# Patient Record
Sex: Male | Born: 1935 | Race: Black or African American | Hispanic: No | Marital: Married | Smoking: Former smoker
Health system: Southern US, Community
[De-identification: ages and names within clinical notes are randomized; demographics above are authoritative.]

## PROBLEM LIST (undated history)

## (undated) DIAGNOSIS — I2510 Atherosclerotic heart disease of native coronary artery without angina pectoris: Secondary | ICD-10-CM

## (undated) DIAGNOSIS — G629 Polyneuropathy, unspecified: Secondary | ICD-10-CM

## (undated) DIAGNOSIS — F0280 Dementia in other diseases classified elsewhere without behavioral disturbance: Secondary | ICD-10-CM

## (undated) DIAGNOSIS — N183 Chronic kidney disease, stage 3 unspecified: Secondary | ICD-10-CM

## (undated) DIAGNOSIS — I714 Abdominal aortic aneurysm, without rupture, unspecified: Secondary | ICD-10-CM

## (undated) DIAGNOSIS — G309 Alzheimer's disease, unspecified: Secondary | ICD-10-CM

## (undated) DIAGNOSIS — E1143 Type 2 diabetes mellitus with diabetic autonomic (poly)neuropathy: Secondary | ICD-10-CM

## (undated) DIAGNOSIS — E1142 Type 2 diabetes mellitus with diabetic polyneuropathy: Secondary | ICD-10-CM

## (undated) DIAGNOSIS — I10 Essential (primary) hypertension: Secondary | ICD-10-CM

## (undated) DIAGNOSIS — E785 Hyperlipidemia, unspecified: Secondary | ICD-10-CM

## (undated) HISTORY — DX: Essential (primary) hypertension: I10

## (undated) HISTORY — DX: Alzheimer's disease, unspecified: G30.9

## (undated) HISTORY — DX: Chronic kidney disease, stage 3 unspecified: N18.30

## (undated) HISTORY — DX: Abdominal aortic aneurysm, without rupture: I71.4

## (undated) HISTORY — DX: Dementia in other diseases classified elsewhere, unspecified severity, without behavioral disturbance, psychotic disturbance, mood disturbance, and anxiety: F02.80

## (undated) HISTORY — DX: Hyperlipidemia, unspecified: E78.5

## (undated) HISTORY — DX: Type 2 diabetes mellitus with diabetic polyneuropathy: E11.42

## (undated) HISTORY — DX: Chronic kidney disease, stage 3 (moderate): N18.3

## (undated) HISTORY — DX: Abdominal aortic aneurysm, without rupture, unspecified: I71.40

## (undated) HISTORY — PX: OTHER SURGICAL HISTORY: SHX169

## (undated) HISTORY — DX: Type 2 diabetes mellitus with diabetic autonomic (poly)neuropathy: E11.43

## (undated) HISTORY — DX: Polyneuropathy, unspecified: G62.9

## (undated) HISTORY — DX: Atherosclerotic heart disease of native coronary artery without angina pectoris: I25.10

---

## 1998-02-19 ENCOUNTER — Ambulatory Visit (HOSPITAL_COMMUNITY): Admission: RE | Admit: 1998-02-19 | Discharge: 1998-02-19 | Payer: Self-pay | Admitting: Cardiology

## 1998-03-08 ENCOUNTER — Ambulatory Visit (HOSPITAL_COMMUNITY): Admission: RE | Admit: 1998-03-08 | Discharge: 1998-03-08 | Payer: Self-pay | Admitting: Cardiology

## 1999-06-10 ENCOUNTER — Emergency Department (HOSPITAL_COMMUNITY): Admission: EM | Admit: 1999-06-10 | Discharge: 1999-06-10 | Payer: Self-pay | Admitting: Emergency Medicine

## 1999-06-10 ENCOUNTER — Encounter: Payer: Self-pay | Admitting: *Deleted

## 1999-09-11 ENCOUNTER — Ambulatory Visit (HOSPITAL_COMMUNITY): Admission: RE | Admit: 1999-09-11 | Discharge: 1999-09-11 | Payer: Self-pay | Admitting: Gastroenterology

## 2004-09-26 ENCOUNTER — Ambulatory Visit (HOSPITAL_COMMUNITY): Admission: RE | Admit: 2004-09-26 | Discharge: 2004-09-26 | Payer: Self-pay | Admitting: Gastroenterology

## 2004-09-26 ENCOUNTER — Encounter (INDEPENDENT_AMBULATORY_CARE_PROVIDER_SITE_OTHER): Payer: Self-pay | Admitting: Specialist

## 2004-11-08 ENCOUNTER — Encounter: Admission: RE | Admit: 2004-11-08 | Discharge: 2004-11-08 | Payer: Self-pay | Admitting: Family Medicine

## 2005-01-22 ENCOUNTER — Encounter: Admission: RE | Admit: 2005-01-22 | Discharge: 2005-01-22 | Payer: Self-pay | Admitting: Internal Medicine

## 2006-10-27 DIAGNOSIS — I2510 Atherosclerotic heart disease of native coronary artery without angina pectoris: Secondary | ICD-10-CM

## 2006-10-27 HISTORY — PX: CARDIAC CATHETERIZATION: SHX172

## 2006-10-27 HISTORY — DX: Atherosclerotic heart disease of native coronary artery without angina pectoris: I25.10

## 2007-07-01 ENCOUNTER — Inpatient Hospital Stay (HOSPITAL_COMMUNITY): Admission: EM | Admit: 2007-07-01 | Discharge: 2007-07-03 | Payer: Self-pay | Admitting: Emergency Medicine

## 2007-07-23 ENCOUNTER — Ambulatory Visit (HOSPITAL_COMMUNITY): Admission: RE | Admit: 2007-07-23 | Discharge: 2007-07-23 | Payer: Self-pay | Admitting: Cardiology

## 2007-08-16 ENCOUNTER — Inpatient Hospital Stay (HOSPITAL_COMMUNITY): Admission: EM | Admit: 2007-08-16 | Discharge: 2007-08-19 | Payer: Self-pay | Admitting: Emergency Medicine

## 2007-09-10 ENCOUNTER — Ambulatory Visit: Payer: Self-pay | Admitting: Cardiology

## 2007-09-14 ENCOUNTER — Ambulatory Visit: Payer: Self-pay | Admitting: Cardiology

## 2007-10-25 ENCOUNTER — Ambulatory Visit: Payer: Self-pay | Admitting: Cardiology

## 2008-01-26 HISTORY — PX: DOPPLER ECHOCARDIOGRAPHY: SHX263

## 2008-11-27 HISTORY — PX: NM MYOVIEW LTD: HXRAD82

## 2009-09-13 ENCOUNTER — Encounter: Admission: RE | Admit: 2009-09-13 | Discharge: 2009-09-13 | Payer: Self-pay | Admitting: Internal Medicine

## 2011-02-26 ENCOUNTER — Ambulatory Visit (HOSPITAL_COMMUNITY)
Admission: RE | Admit: 2011-02-26 | Discharge: 2011-02-26 | Disposition: A | Discharge status: Home / Self Care | Payer: Medicare Other | Source: Ambulatory Visit | Attending: Internal Medicine | Admitting: Internal Medicine

## 2011-02-26 DIAGNOSIS — M79609 Pain in unspecified limb: Secondary | ICD-10-CM | POA: Insufficient documentation

## 2011-02-26 DIAGNOSIS — I739 Peripheral vascular disease, unspecified: Secondary | ICD-10-CM

## 2011-03-11 NOTE — Discharge Summary (Signed)
NAMEJUBAL, RADEMAKER NO.:  [id number]   MEDICAL RECORD NO.:  [id number]          PATIENT TYPE:  INP   LOCATION:  3729                         FACILITY:  [facility]   PHYSICIAN:  Madaline Savage, M.D.DATE OF BIRTH:  Jul 28, 1934   DATE OF ADMISSION:  07/01/2007  DATE OF DISCHARGE:  07/03/2007                               DISCHARGE SUMMARY   DISCHARGE DIAGNOSES:  1. Unstable angina with mild elevation in troponin but negative CK-MB.  2. Coronary artery disease with diffuse nonobstructive disease in the      RCA and the LAD but more small vessel disease of the second      diagonal branch, 98% stenosis x2 but a very small vessel, and the      OM 3, again 80% stenosis but a small vessel.  To be treated      medically with normal ejection fraction of 60%.  3. Questionable iliac aneurysm for outpatient.  CT of the iliacs to      evaluate aneurysm more fully.  4. Renal insufficiency, slightly elevated on I stat and then totally      abnormal at 3 which we felt was a mistake, though at discharge his      creatinine was 1.47.  5. Diabetes mellitus 2, stable.  6. Hyperlipidemia.  Restarted on Zocor.  7. History of coronary disease with previous angioplasty of distal      right coronary artery in October 1998.  8. Hypertension.   DISCHARGE CONDITION:  Improved.   PROCEDURES:  Combined left heart cath on July 02, 2007 by Dr.  Susa Griffins.   DISCHARGE MEDICATIONS:  1. Janumet 50/500 mg daily.  Do not start taking until 07/05/2007.      Wait for cath lab dye to clear his system.  2. Zocor 40 mg every evening.  3. Niaspan 500 mg daily.  4. Aspirin 325 mg daily.  5. Norvasc 5 mg daily x 10 days, and then hopefully we will be able to      let patient restart Lotrel.  6. Toprol XL 25 mg daily.  7. Nitroglycerin sublingual under tongue as needed for chest pain.  8. Imdur 30 mg daily.   FOLLOWUP:  Have lab work done in 1 week to check his kidney function.  Stop  taking Lotrel at that time.   DISCHARGE INSTRUCTIONS:  1. Low-sodium, heart-healthy, diabetic diet.  2. Increase activity slowly.  3. May shower or bathe.  4. No lifting for 2 days.  5. No driving for 2 days.  6. Keep groin cath sites clean and dry.  Call for redness, swelling,      drainage, or pain.   FURTHER FOLLOWUP:  1. Follow up with Dr. Elsie Lincoln within 2 weeks or our office will call      you with date and time.  2. Obtain CT of iliacs to evaluate for aneurysm.  Our office will      arrange as well, but we will wait for a couple of weeks until      creatinine has normalized.   HISTORY OF  PRESENT ILLNESS:  The patient is a 77 year old African  American male with a previous MI in 1998 with a 100% occluded RCA who  underwent angioplasty that was stable.  He had not seen Dr. Elsie Lincoln for  some years, and he was in a good state of health until July 01, 2007.  He was sitting at home watching television.  He began having mild  substernal chest pain, left arm discomfort, mild shortness of breath,  diaphoresis but no nausea or vomiting.  Chest pain was relieved with one  sublingual nitroglycerin, and in the ER, he was chest pain free.  This  was the first episode he had had since his previous MI, and he thought  he was having an MI this time.   PAST MEDICAL HISTORY:  1. Diabetes mellitus.  2. Hypertension.  3. Hyperlipidemia.  4. Remote back surgery.  5. Myocardial infarction as stated.   Family history, social history, and review of systems, see H&P.   ALLERGIES:  DEMEROL.   OUTPATIENT MEDICATIONS:  1. Lotrel 540.  2. Janumet 5500.  3. He had stopped taking his cholesterol meds.  4. He does take an aspirin.   PHYSICAL EXAMINATION AT DISCHARGE:  VITAL SIGNS:  Blood pressure 134/87,  pulse 64, respirations 18, temperature 97.4.  Oxygen saturations on room  air 98%.  HEART:  Regular rate and rhythm.  PULMONARY:  Lungs were clear except for occasional expiratory  wheezes.  He had had some cold symptoms for about a month.  GENITOURINARY:  Right groin cath site was stable.  No hematoma.  No  bruits and 2+ pedal.   LABORATORY DATA:  Hemoglobin 12.6, hematocrit 37.4, WBC 4.4, platelets  182,000.  These remained stable throughout hospitalization.  Chemistry  on admission:  Sodium 137, potassium 4.4, chloride 104, CO2 26, BUN 22.  Creatinine initially on I stat was 3.4; followup was 1.52, and a further  followup was 1.37.  Glucose 131.  We held his ACE during the whole  hospitalization.   Pro time 13.5, INR of 1, PTT 34, heparin 1.51.  LFTs were normal.  Cardiac troponin Is were 0.16, 0.09, and 0.07.   CKs were all negative at 84, 74, and 120 with MBs of 2.6, 2.5, and 3.2.   Cholesterol 204.  LDL was 161, HDL 30, triglycerides 66.  Magnesium 2.1  and calcium 9.2.  BNP was less than 30.  Glycohemoglobin was elevated at  7.8, but he is diabetic, and TSH 1.442.   CHEST X-RAY:  No acute disease.  Lungs were clear.  Heart size normal.  No effusion or focal bony abnormality.   CARDIAC CATHETERIZATION:  Please see dictated report, but please also  note that he had no abdominal aortic aneurysm, but there was aneurysmal  dilatation from the mid to distal portion of the common iliacs  bilaterally, felt to be mild to moderate.  There was good flow.  Hypogastrics were intact bilaterally.  No significant common or external  iliac stenosis.  Cardiac disease as previously discussed.   HOSPITAL COURSE:  The patient was admitted by Dr. Domingo Sep on July 01, 2007 secondary to chest pain.  He was brought in to rule out MI.  Troponins were up.  CK-MBs were negative, felt to be secondary to  unstable angina.  He underwent cardiac cath and was found to have small  vessel disease, but his larger vessels were nonobstructive disease.  Medications were adjusted with adding Imdur and Toprol.  For his  wheezes, we added Mucinex over-the-counter, and we will also need  a CT  of his iliacs to evaluate further his aneurysmal iliacs.  During the  hospitalization, there was a question of how stable his creatinine was  since we got an I stat that was 3.5.  Most likely, this was erroneous,  but we have held his ACE here in the hospital.  He got a lot of fluids  and Mucomyst, and he will have a repeat basic metabolic panel done 1  week.  At that time, we can restart his Lotrel as long as everything is  stable.  He will follow up with Dr. Elsie Lincoln as stated.      Darcella Gasman. Annie Paras, N.P.    ______________________________  Madaline Savage, M.D.    LRI/MEDQ  D:  07/03/2007  T:  07/03/2007  Job:  [id number]   cc:   Madaline Savage, M.D.  Eric L. [name], M.D.

## 2011-03-11 NOTE — Discharge Summary (Signed)
NAMEJAMEEK, Jake Rollins NO.:  [id number]   MEDICAL RECORD NO.:  [id number]          PATIENT TYPE:  INP   LOCATION:  2006                         FACILITY:  [facility]   PHYSICIAN:  Madaline Savage, M.D.DATE OF BIRTH:  Nov 12, 1933   DATE OF ADMISSION:  08/16/2007  DATE OF DISCHARGE:  08/19/2007                               DISCHARGE SUMMARY   DISCHARGE DIAGNOSES:  1. Status post non-ST elevation myocardial infarction during this      admission.  2. Acute bronchitis.  3. Known coronary artery disease, status post history of myocardial      infarction in the past.  Last cath was in September 2008 showing      diffuse severe coronary disease involving small vessel not amenable      to intervention.  4. Hypertension.  5. Adult-onset diabetes mellitus.  6. Hyperlipidemia.  7. Renal insufficiency.   HISTORY:  This is a 77 year old African-American gentleman with previous  history of coronary disease with myocardial infarction and recent  catheterization in September 2008 showing diffuse severe coronary  disease on medical management, who presented to the emergency room with  complaints of chest pain.  He had lingering pain and discomfort in the  chest over the weekend, but in the morning on Monday, he felt that  symptoms got worse and decided to come to [facility] for  evaluation.  He also complained of pleuritic chest pain with deep  inspiration and productive cough of greenish sputum.  The patient also  had complaints of dyspnea and even panting with exertion.  We saw him in  the emergency room and admitted him to the telemetry unit with rule out  MI protocol.   His enzymes were abnormally elevated, but EKG did not reveal any acute  changes.  The patient was diagnosed with non-ST elevation myocardial  infarction.  Because of his previous recent catheterization and renal  insufficiency, the decision was made to treat him medically.  He was  started on Ranexa  with the dose of Imdur being adjusted in the hospital.   The patient's blood pressure also was running on the higher side, so we  added Clonidine.  For his bronchitis, we were treating him initially  with Zithromax, but after adding Ranexa and interactions between these 2  medications, Zithromax-the latter was switched to Augmentin.  On the day  of discharge, the patient was seen by Dr. Elsie Lincoln and considered to be  stable for discharge home.   LABORATORY DATA:  White blood cell count was 7.6, hemoglobin 11.2,  hematocrit 33.6, platelet count 116,  sodium 133, potassium 4.7,  chloride 99, CO2 26, BUN 22, creatinine 2.12, glucose 207.  His enzymes,  first set CK 230, CK-MB 28.6, troponin, 2.75.  Second set CK 216, CK-MB  28 and troponin 2.39.  Third set 206/20.2/3.56 accordingly.   DISCHARGE MEDICATIONS:  1. Simvastatin 40 mg daily.  2. Toprol XL 25 mg daily.  3. Aspirin 325 mg daily.  4. Niaspan 500 mg daily.  5. Norvasc 10 mg daily.  6. Janumet 5/500 mg daily.  7. Imdur 60 mg daily.  8. Ranexa 500 mg b.i.d.  9. Clonidine 0.1 mg b.i.d.  10.Augmentin 875 mg b.i.d.  11.Mucinex 1200 mg b.i.d.   DISCHARGE DIET:  Low-fat, low-salt, low-cholesterol diet.   ACTIVITY:  The patient was instructed to increase activity slowly.   FOLLOW UP:  1. He is to follow up with family practice physician and to address      his issue of bronchitis.  2. Dr. Elsie Lincoln will see the patient in our office on September 02, 2007      at noon.      Jake Rollins, P.A.    ______________________________  Madaline Savage, M.D.    MK/MEDQ  D:  08/19/2007  T:  08/20/2007  Job:  [id number]   cc:   Madaline Savage, M.D.  [place], MD  [facility]

## 2011-03-11 NOTE — Cardiovascular Report (Signed)
NAMECHIN, WACHTER                  ACCOUNT NO.:  [id number]   MEDICAL RECORD NO.:  [id number]          PATIENT TYPE:  INP   LOCATION:  3729                         FACILITY:  [facility]   PHYSICIAN:  Richard A. Alanda Amass, M.D.DATE OF BIRTH:  08-Sep-1934   DATE OF PROCEDURE:  07/02/2007  DATE OF DISCHARGE:                            CARDIAC CATHETERIZATION   __________   PROCEDURE:  Retrograde central aortic catheterization, selective  coronary angiography via Judkins technique, LV angiogram RAO/LAO  projection, abdominal aortic angiogram, midstream PA projection.   PROCEDURE:  The patient was brought to the second floor CP lab in a post-  absorptive state after 5 mg Valium p.o. pre-medication.  He was hydrated  preoperatively and a follow-up BUN and creatinine was 18/1.32 (spurious  value of pain in the ER by I-Stat.  Informed consent was obtained from  the patient and his wife to proceed with diagnostic angiography.  He was  given 5 mg of Valium p.o. pre-medication and 2 mg Versed IV in the  laboratory for sedation.  The right groin was prepped, draped in the  usual manner.  One percent Xylocaine was used for local anesthesia, and  the RC FA was entered with a single anterior puncture using an 18 thin-  wall needle.  Using guidewire exchange, a 6-French, 4-cm taper Cordis  coronary and LV pigtail catheter, a diagnostic coronary angiography was  performed through a 6-French short side arm sheath without difficulty.  LV angiogram was done in the RAO and LAO projection at 25 mL, 14 mL per  second, 20 mL and 12 mL per second.  Pullback pressure to CA was  performed and showed no gradient across the aortic valve.  Abdominal  aortic angiogram was done above the level of the renal arteries 25 mL,  20 mL per second.  Catheters removed.  Side arm sheath was flushed.  Cine-angiograms were reviewed with Dr. Daphene Jaeger.  The patient tolerated  procedure well.  He was brought to the holding area for  sheath removal  and pressure hemostasis, with intact right lower extremity pulses.   PRESSURES:  LV:  145/0; LVEDP 18-20 mmHg.  CA:  45/83 mmHg.   There is no gradient between LV and CA on catheter pullback.   LV angiogram in the RAO and LAO projection showed PVCs; however, post  PVCs beats showed essentially normal LV contraction,  with the exception  of some possible minimal mid-anterolateral wall hypokinesis.  EF was  well preserved and greater than 60%.  There was no mitral regurgitation  present.  Angiographic LVH was seen and mild annular calcification.   Abdominal aortic angiogram demonstrated single essentially normal renal  arteries bilaterally.  There was a 40% narrowing of the infrarenal  abdominal aorta in its mid-position.  The celiac SMA and inferior  mesenterics were intact.  There was moderate atherosclerotic disease but  no abdominal aortic aneurysm.   There was aneurysmal dilatation from the mid-to-distal portion of the  common iliacs bilaterally, felt to be mild to moderate.  There was good  flow.  The hypogastrics were intact bilaterally,  and there was no  significant common or external iliac stenosis.   FLUOROSCOPY:  Underlying fluoroscopy revealed +1 left and right coronary  calcification.   The main left was a long large vessel that was widely patent with no  stenosis.   The left anterior descending artery had aneurysmal dilatation throughout  the proximal third of the vessel.  Beyond the second diagonal, there was  eccentric 40% smooth narrowing, and there was concentric 50% smooth  narrowing, tandem at the junction of the proximal third and mid LAD.  The remainder of the vessel was fairly good caliber and coursed the apex  of the heart where it bifurcated.   The first diagonal was small, arising before the first septal  perforator, and it was diffusely diseased but no significant focal  stenosis.   The second diagonal was small, about a 2-0 vessel or  possibly less.  It  had 90% ostial and proximal stenosis and a 90% tandem stenosis in the  mid-portion.   There was a thin, mildly diseased option of diagonal vessel.  It had no  significant stenosis.   The circumflex artery had aneurysmal dilatation throughout the proximal  mid-portion, without any high-grade stenosis and had a lumpy bumpy  appearance.  There was a size mis-match between the mid-circumflex and  the distal two marginal branches.  The distal third marginal branch had  80% concentric ostial narrowing and an 80% mid-narrowing, and a small  probably 2-mm or less vessel.   The distal fourth marginal branch had likewise 80% proximal and 75% to  80% mid-lesion.  Both these marginals were small and diffusely diseased,  but there was good flow.   The right coronary was a dominant vessel with the PDA arising from it.  There was 40% to 50% lumpy, bumpy irregularity and narrowing at the  junction of the proximal third, 30% to 40% smooth narrowing before the  acute margin and 20% narrowing before the PDA, which was a long thin  vessel with no significant disease.  The posterolateral branch was  small, and most of this territory was perfused by the distal circumflex  marginal branches.   DISCUSSION:  Mr. Peine is a patient of Dr. Truett Perna.  He has  hypertension, AODM, hyperlipidemia.  He states that he had a myocardial  infarction in the late 90s and had a PTCA procedure, but he is not aware  of the details.  At present, there are no records from the office or  archives.  They are not available, and there are no hospital or cath lab  records concerning this.  In any event, he has not had re-cath since  that time.  He has been on medical therapy and has been stable and  apparently had a negative stress test 4 years ago.   He was admitted to the hospital July 01, 2007 with an episode of  substernal chest discomfort, radiating to the left arm with mild  shortness of breath.   No nausea, vomiting or diuresis.  EMS was called.  Chest pain was relieved with one nitroglycerin, and he has been pain  free since that time.  Myocardial infarction was ruled out by negative  CPK and normal MB; however, he had mild troponin of elevation to 0.16.  The patient had gone off statins on his own.  It is not clear why, and  his cholesterol was 240, HDL of 30 and LDL of 161 on admission, off  Statin therapy.  He had  been on Lotrel, Janumet.  He had stopped aspirin  and cholesterol Statin therapy on his own.   Coronary angiography shows a moderately severe to severe diffuse disease  of the distal OM3 and OM4 branches and the second diagonal.  These were  all small branches approximately 2 mm or less and do appear diffusely  diseased.  There is no high-grade disease of the co-dominant large right  of the LAD, OD and proximal marginal branches.  I reviewed these cines  with Dr. Tresa Endo.  He felt due to the diffuse nature of disease and small  vessels involved and the fact that the patient has not been under the  recent Statin or beta-blocker therapy that he should be treated  medically.  Would recommend medical therapy at present, re-institution  of Statins.  I would recommend a CT of his abdomen and of his iliacs at  a future date, to assess the size of his common iliac arteries.  She  does have aneurysmal dilatation, but it only appears to be mild to  moderate at most.   CATHETERIZATION/DIAGNOSIS:  1. Chest pain with known coronary disease and mild troponin elevation,      new onset.  2. History of coronary artery disease with history of prior myocardial      infarction, 1990s, apparent PTCA at that time.  Site of this not      currently known, with old records pending.  3. Systemic hypertension.  4. Adult onset diabetes mellitus.  5. Severe hyperlipidemia, currently off Statin therapy.  6. Diffuse disease of small DX2 and distal marginals OM3 and OM4 as      outlined above.   Well-preserved LV function with LVH.  7. Systemic hypertension.      Richard A. Alanda Amass, M.D.  Electronically Signed     RAW/MEDQ  D:  07/02/2007  T:  07/02/2007  Job:  [id number]   cc:   Madaline Savage, M.D.  Eric L. [name], M.D.  Medical Records  [facility]

## 2011-03-14 NOTE — Op Note (Signed)
NAMEGWYN, Jake Rollins                  ACCOUNT NO.:  [id number]   MEDICAL RECORD NO.:  [id number]          PATIENT TYPE:  AMB   LOCATION:  ENDO                         FACILITY:  [facility]   PHYSICIAN:  Jordan Hawks. Elnoria Howard, MD    DATE OF BIRTH:  1934-04-29   DATE OF PROCEDURE:  09/26/2004  DATE OF DISCHARGE:                                 OPERATIVE REPORT   PROCEDURE PERFORMED:  Colonoscopy.   INDICATIONS FOR PROCEDURE:  For family history of colon cancer.   CONSENT:  Informed consent was obtained from the patient describing the  risks of bleeding, infection, perforation, medication reaction, a 10% miss  rate for a small colon cancer or polyp and the risk of death, all of which  are not exclusive of any other complications that can occur.   ENDOSCOPIST:  Jordan Hawks. Elnoria Howard, MD   INSTRUMENT USED:  Olympus adult colonoscope.   PHYSICAL EXAMINATION:  CARDIAC:  Regular rate and rhythm.  LUNGS:  Clear to auscultation bilaterally.  ABDOMEN:  Flat, soft, nontender, nondistended.   MEDICATIONS GIVEN:  In addition to the medications provided with the EGD, an  additional 50 mcg of fentanyl was injected and 3 mg of Versed was injected.   DESCRIPTION OF PROCEDURE:  After the esophagogastroduodenoscopy was  performed, the patient was placed in the position for the colonoscopy.  A  rectal examination was performed which was negative for any palpable  abnormalities.  The colonoscope was then introduced into the anus and  advanced under direct visualization to the terminal ileum without  difficulty.  The patient was noted to have an excellent prep.  Photodocumentation of the terminal ileum and the cecum was obtained.  Upon  slow withdrawal of the colonoscope, there was no evidence of any masses,  ulcerations, inflammation, erosions, diverticula or vascular abnormalities  in the cecum, ascending, transverse, descending or sigmoid colon.  With  close inspection of rectosigmoid region as well as the rectum,  the patient  was noted to have multiple polyps which were consistent in gross appearance  with hyperplastic polyps.  However, several polyps were removed, one polyp  measuring 5 mm was removed with a hot snare polypectomy.  Two 3 to 5 mm  polyps were removed adjacent to the initial hot snare.  These polyps were  removed with a cold snare polypectomy.  Upon further withdrawal of the  colonoscope in the rectum, a 5 mm polyp was also identified and removed with  hot snare polypectomy.  Retroflexion revealed large internal and external  hemorrhoids.  The endoscope was then straightened and withdrawn from the  patient.  The procedure was terminated.  No complications were encountered  and the patient tolerated the procedure well.   PLAN:  1.  Follow-up on biopsies.  2.  Repeat colonoscopy in three to five years.       PDH/MEDQ  D:  09/26/2004  T:  09/26/2004  Job:  [id number]

## 2011-03-14 NOTE — Procedures (Signed)
[place]  Patient:    Jake Rollins                            MRN: [id number] Proc. Date: 09/11/99 Adm. Date:  [id number] Attending:  Charna Elizabeth CC:         Lind Guest. [name], M.D.                           Procedure Report  DATE OF BIRTH:  Jun 25, 1934  PROCEDURE PERFORMED:  Colonoscopy with biopsies.  ENDOSCOPIST:  Anselmo Rod, M.D.  INSTRUMENT USED:  Olympus video colonoscope.  INDICATIONS:  Blood in the stool (trace positive), in a 77 year old black male ith a family history of colon cancer.  Rule out polyps, arteriovenous malformations, masses, hemorrhoids, etc.  PREPROCEDURE PREPARATION:  An informed consent was procured from the patient. he patient was fasted for eight hours prior to the procedure, and prepped with a bottle of magnesium citrate and a gallon of NULYTELY on the night prior to the procedure.  PREPROCEDURE PHYSICAL EXAMINATION:  VITAL SIGNS:  Stable.  NECK:  Supple.  CHEST:  Clear to auscultation.  HEART:  S1, S2 regular.  ABDOMEN:  Soft with normal abdominal bowel sounds.  DESCRIPTION OF PROCEDURE:  The patient was placed in the left lateral decubitus  position and sedated with 50 mg of fentanyl and 3 mg of Versed intravenously. nce the patient was adequately sedated and maintained on low-flow oxygen and continuous cardiac monitoring, the Olympus video colonoscope was advanced from the rectum o the cecum without difficulty.  One diminutive polyp was removed from 40.0 cm, by a cold biopsy forceps.  Two small polyps were removed from the rectum by cold biopsy forceps.  As well, the patient had small internal hemorrhoids and tolerated the  procedure well without complications.  IMPRESSION: 1. Small nonbleeding internal hemorrhoids. 2. Two diminutive polyps in the rectum, removed by cold biopsy forceps. 3. One flat polyp removed from 40.0 cm by regular biopsy forceps.  RECOMMENDATIONS: 1. Await  pathology. 2. Outpatient followup in the next two weeks. 3. Avoid nonsteroidals for now. 4. Further recommendations to be made once the biopsy results have been    procured. DD:  09/11/99 TD:  09/12/99 Job: [id number]

## 2011-03-14 NOTE — Op Note (Signed)
NAMELOYD, MARHEFKA                  ACCOUNT NO.:  [id number]   MEDICAL RECORD NO.:  [id number]          PATIENT TYPE:  AMB   LOCATION:  ENDO                         FACILITY:  [facility]   PHYSICIAN:  Jordan Hawks. Elnoria Howard, MD    DATE OF BIRTH:  December 29, 1933   DATE OF PROCEDURE:  09/26/2004  DATE OF DISCHARGE:                                 OPERATIVE REPORT   PROCEDURE:  EGD.   INDICATIONS FOR PROCEDURE:  Iron-deficiency anemia.   ENDOSCOPIST:  Jordan Hawks. Elnoria Howard, M.D.   EQUIPMENT USED:  An adult Olympus endoscope.   CONSENT:  Informed consent was obtained from the patient describing the  risks of bleeding, infection, perforation, medication reaction and the risk  of death; all of which are not exclusive of any other complications that may  occur.   PHYSICAL EXAMINATION:  CARDIOVASCULAR:  Regular rate and rhythm.  LUNGS:  Clear to auscultation bilaterally.  ABDOMEN:  Flat, soft, nontender and nondistended.   MEDICATIONS:  Fentanyl 50 mcg IV and Versed 7 mg IV.   DESCRIPTION OF PROCEDURE:  After adequate sedation was achieved, endoscope  was advanced from the oral cavity to the proximal small-bowel under direct  visualization. Photo documentation of the duodenum was obtained.  Upon  inspection of the mucosa, the patient was noted to have a probable AVM in  the second portion of the duodenum.  There was no evidence of any active  bleeding.  Several small random biopsies were obtained of the small-bowel.  Upon withdrawal of the endoscope into the gastric lumen, close inspection  did reveal a small erosion in the antrum, probable gastritis and there is  evidence of a very small healing ulcer in the upper portion of the body in  the greater curvature region.  Retroflexion was negative for any masses in  the cardia.  The endoscope was then straightened and withdrawn into the  esophagus and the Z line is documented to be approximately 43 cm from the  incisors.  No evidence of any esophagitis or  hiatal hernia.  The endoscope  was then withdrawn from the patient and the procedure was terminated.  Several random biopsies were obtained in the stomach.  The scope was then  withdrawn and the procedure was terminated.  The patient tolerated the  procedure well.  No complications were encountered.   PLAN:  1.  Follow up on biopsies.  2.  Avoid all NSAIDs.       PDH/MEDQ  D:  09/26/2004  T:  09/26/2004  Job:  [id number]

## 2011-08-06 LAB — COMPREHENSIVE METABOLIC PANEL
ALT: 14
Alkaline Phosphatase: 79
CO2: 27
GFR calc non Af Amer: 34 — ABNORMAL LOW
Glucose, Bld: 163 — ABNORMAL HIGH
Potassium: 4.9
Sodium: 134 — ABNORMAL LOW
Total Bilirubin: UNDETERMINED

## 2011-08-06 LAB — CARDIAC PANEL(CRET KIN+CKTOT+MB+TROPI)
CK, MB: 27.6 — ABNORMAL HIGH
CK, MB: 28 — ABNORMAL HIGH
CK, MB: 28.6 — ABNORMAL HIGH
Relative Index: 11 — ABNORMAL HIGH
Relative Index: 13 — ABNORMAL HIGH
Relative Index: 8.3 — ABNORMAL HIGH
Total CK: 216
Total CK: 230
Total CK: 250 — ABNORMAL HIGH

## 2011-08-06 LAB — CBC
HCT: 33.6 — ABNORMAL LOW
HCT: 36.6 — ABNORMAL LOW
HCT: 37.2 — ABNORMAL LOW
Hemoglobin: 12.3 — ABNORMAL LOW
Hemoglobin: 12.4 — ABNORMAL LOW
Hemoglobin: 13
MCHC: 33.1
MCHC: 33.3
MCHC: 33.6
MCV: 85.9
MCV: 86.5
MCV: 86.7
Platelets: 133 — ABNORMAL LOW
Platelets: 142 — ABNORMAL LOW
Platelets: 159
RBC: 3.88 — ABNORMAL LOW
RBC: 4.57
RDW: 13.9
RDW: 14
RDW: 14.2 — ABNORMAL HIGH
RDW: 14.3 — ABNORMAL HIGH
WBC: 7.6
WBC: 9

## 2011-08-06 LAB — PROTIME-INR
INR: 1
Prothrombin Time: 12.9

## 2011-08-06 LAB — BASIC METABOLIC PANEL
BUN: 18
BUN: 22
CO2: 26
Calcium: 9.6
Chloride: 99
Creatinine, Ser: 1.94 — ABNORMAL HIGH
Creatinine, Ser: 2.12 — ABNORMAL HIGH
GFR calc Af Amer: 44 — ABNORMAL LOW
GFR calc non Af Amer: 37 — ABNORMAL LOW
Glucose, Bld: 117 — ABNORMAL HIGH
Glucose, Bld: 207 — ABNORMAL HIGH
Potassium: 4.7
Sodium: 139

## 2011-08-06 LAB — DIFFERENTIAL
Basophils Absolute: 0
Basophils Relative: 0
Eosinophils Relative: 5
Lymphocytes Relative: 11 — ABNORMAL LOW
Lymphocytes Relative: 9 — ABNORMAL LOW
Lymphs Abs: 1
Monocytes Absolute: 1 — ABNORMAL HIGH
Monocytes Absolute: 1.1 — ABNORMAL HIGH
Neutro Abs: 6.5
Neutro Abs: 7.5
Neutrophils Relative %: 74

## 2011-08-06 LAB — TROPONIN I: Troponin I: 1.1

## 2011-08-06 LAB — HEPATIC FUNCTION PANEL
AST: 41 — ABNORMAL HIGH
Albumin: 3.6
Bilirubin, Direct: 0.1
Total Protein: 7.5

## 2011-08-06 LAB — APTT: aPTT: 33

## 2011-08-06 LAB — HEPARIN LEVEL (UNFRACTIONATED): Heparin Unfractionated: 0.43

## 2011-08-08 LAB — BASIC METABOLIC PANEL
BUN: 15
CO2: 27
Chloride: 107
Chloride: 108
Creatinine, Ser: 1.47
GFR calc Af Amer: 57 — ABNORMAL LOW
GFR calc Af Amer: >60
GFR calc non Af Amer: 47 — ABNORMAL LOW
Glucose, Bld: 110 — ABNORMAL HIGH
Sodium: 140
Sodium: 141

## 2011-08-08 LAB — I-STAT 8, (EC8 V) (CONVERTED LAB)
Acid-Base Excess: 8 — ABNORMAL HIGH
Acid-base deficit: 2
BUN: 9
Chloride: 101
Chloride: 105
HCT: 41
Hemoglobin: 13.9
Operator id: 277751
Potassium: 3.8
Potassium: 4
Sodium: 137
TCO2: 24
pCO2, Ven: 40 — ABNORMAL LOW
pH, Ven: 7.373 — ABNORMAL HIGH
pH, Ven: 7.508 — ABNORMAL HIGH

## 2011-08-08 LAB — POCT CARDIAC MARKERS
CKMB, poc: 1.4
Myoglobin, poc: 185
Operator id: 277751

## 2011-08-08 LAB — DIFFERENTIAL
Basophils Absolute: 0
Basophils Relative: 1
Eosinophils Absolute: 0.3
Eosinophils Absolute: 0.3
Eosinophils Relative: 7 — ABNORMAL HIGH
Eosinophils Relative: 8 — ABNORMAL HIGH
Lymphs Abs: 1.6
Monocytes Absolute: 0.4
Monocytes Absolute: 0.4
Monocytes Relative: 8

## 2011-08-08 LAB — CK TOTAL AND CKMB (NOT AT ARMC)
CK, MB: 2.5
CK, MB: 2.6
Relative Index: 2.7 — ABNORMAL HIGH
Relative Index: INVALID
Total CK: 120
Total CK: 74
Total CK: 84

## 2011-08-08 LAB — CBC
HCT: 35.4 — ABNORMAL LOW
HCT: 37.4 — ABNORMAL LOW
Hemoglobin: 11.9 — ABNORMAL LOW
Hemoglobin: 12.6 — ABNORMAL LOW
MCHC: 33.8
MCV: 85.2
MCV: 86.1
Platelets: 161
Platelets: 182
RBC: 4.12 — ABNORMAL LOW
RBC: 4.15 — ABNORMAL LOW
RDW: 13.5
WBC: 4.4
WBC: 6.2

## 2011-08-08 LAB — POCT I-STAT CREATININE
Creatinine, Ser: 1.6 — ABNORMAL HIGH
Creatinine, Ser: 3.4 — ABNORMAL HIGH
Operator id: 277751
Operator id: 277751

## 2011-08-08 LAB — COMPREHENSIVE METABOLIC PANEL
ALT: <8
Albumin: 3.4 — ABNORMAL LOW
Alkaline Phosphatase: 67
BUN: 22
Chloride: 104
Glucose, Bld: 131 — ABNORMAL HIGH
Potassium: 4.4
Sodium: 137
Total Bilirubin: 0.8
Total Protein: 6.8

## 2011-08-08 LAB — LIPID PANEL: LDL Cholesterol: 161 — ABNORMAL HIGH

## 2011-08-08 LAB — TROPONIN I
Troponin I: 0.07 — ABNORMAL HIGH
Troponin I: 0.16 — ABNORMAL HIGH

## 2011-08-08 LAB — HEPARIN LEVEL (UNFRACTIONATED): Heparin Unfractionated: 1.51 — ABNORMAL HIGH

## 2011-08-08 LAB — PROTIME-INR: Prothrombin Time: 13.5

## 2012-09-30 DIAGNOSIS — I2510 Atherosclerotic heart disease of native coronary artery without angina pectoris: Secondary | ICD-10-CM | POA: Insufficient documentation

## 2012-09-30 DIAGNOSIS — N189 Chronic kidney disease, unspecified: Secondary | ICD-10-CM | POA: Insufficient documentation

## 2012-09-30 LAB — HEPATIC FUNCTION PANEL
ALT: 31 U/L (ref 10–40)
AST: 24 U/L (ref 14–40)

## 2012-09-30 LAB — BASIC METABOLIC PANEL
Glucose: 85 mg/dL
Potassium: 4.2 mmol/L (ref 3.4–5.3)
Sodium: 136 mmol/L — AB (ref 137–147)

## 2012-12-07 ENCOUNTER — Encounter: Payer: Self-pay | Admitting: Hematology

## 2012-12-07 DIAGNOSIS — I10 Essential (primary) hypertension: Secondary | ICD-10-CM | POA: Insufficient documentation

## 2012-12-07 DIAGNOSIS — F0390 Unspecified dementia without behavioral disturbance: Secondary | ICD-10-CM

## 2012-12-07 DIAGNOSIS — N189 Chronic kidney disease, unspecified: Secondary | ICD-10-CM

## 2012-12-07 DIAGNOSIS — F0280 Dementia in other diseases classified elsewhere without behavioral disturbance: Secondary | ICD-10-CM | POA: Insufficient documentation

## 2012-12-07 DIAGNOSIS — I2510 Atherosclerotic heart disease of native coronary artery without angina pectoris: Secondary | ICD-10-CM

## 2013-05-06 ENCOUNTER — Telehealth: Payer: Self-pay | Admitting: *Deleted

## 2013-05-06 NOTE — Telephone Encounter (Signed)
Left message to callback about the quantity of simvastatin.

## 2013-05-16 ENCOUNTER — Encounter: Payer: Self-pay | Admitting: Cardiology

## 2013-05-16 ENCOUNTER — Ambulatory Visit (INDEPENDENT_AMBULATORY_CARE_PROVIDER_SITE_OTHER): Payer: Medicare Other | Admitting: Cardiology

## 2013-05-16 VITALS — BP 150/80 | HR 74 | Ht 74.0 in | Wt 201.3 lb

## 2013-05-16 DIAGNOSIS — E785 Hyperlipidemia, unspecified: Secondary | ICD-10-CM

## 2013-05-16 DIAGNOSIS — I714 Abdominal aortic aneurysm, without rupture: Secondary | ICD-10-CM

## 2013-05-16 DIAGNOSIS — F0280 Dementia in other diseases classified elsewhere without behavioral disturbance: Secondary | ICD-10-CM

## 2013-05-16 DIAGNOSIS — R609 Edema, unspecified: Secondary | ICD-10-CM

## 2013-05-16 DIAGNOSIS — I2510 Atherosclerotic heart disease of native coronary artery without angina pectoris: Secondary | ICD-10-CM

## 2013-05-16 DIAGNOSIS — I10 Essential (primary) hypertension: Secondary | ICD-10-CM

## 2013-05-16 DIAGNOSIS — R600 Localized edema: Secondary | ICD-10-CM

## 2013-05-16 MED ORDER — NITROGLYCERIN 0.4 MG SL SUBL: 0.4000 mg | SUBLINGUAL_TABLET | SUBLINGUAL | Status: DC | PRN

## 2013-05-16 MED ORDER — FUROSEMIDE 20 MG PO TABS: 40.0000 mg | ORAL_TABLET | ORAL | Status: DC

## 2013-05-16 MED ORDER — METOPROLOL SUCCINATE ER 25 MG PO TB24: 25.0000 mg | ORAL_TABLET | Freq: Every day | ORAL | Status: DC

## 2013-05-16 NOTE — Telephone Encounter (Signed)
Pt had an appointment today along with his wife is present. Mrs Hardman states she wanted RX of Metoprolol sent to [facility] [address]

## 2013-05-16 NOTE — Patient Instructions (Addendum)
Contact Dr [name] about possible assistance with nursing home placement as well as Social Service for assistance  12 month follow up with Dr Herbie Baltimore   Change metoprolol tart to metoprolol succ. 25 mg. Once a day. Furosemide 20 mg  Every other day

## 2013-06-02 ENCOUNTER — Encounter: Payer: Self-pay | Admitting: Cardiology

## 2013-06-02 DIAGNOSIS — I714 Abdominal aortic aneurysm, without rupture, unspecified: Secondary | ICD-10-CM | POA: Insufficient documentation

## 2013-06-02 DIAGNOSIS — R600 Localized edema: Secondary | ICD-10-CM | POA: Insufficient documentation

## 2013-06-02 DIAGNOSIS — E785 Hyperlipidemia, unspecified: Secondary | ICD-10-CM | POA: Insufficient documentation

## 2013-06-02 NOTE — Assessment & Plan Note (Signed)
This seems to be the major problem for the patient.  His wife is very concerned and I think she needs some help.  I think she needs some help with reading I have given him placed in a nursing facility.  I'm not sure 30 outpatient social workers available, the going to social services.  I recommended that she contact her primary physician to see if anything available locally.

## 2013-06-02 NOTE — Assessment & Plan Note (Addendum)
He does not seem having any significant symptoms consistent with angina.  At this point, short of him having acute coronary syndrome, myofascial to proceed with medical therapy.  Plan: Continue aspirin, once daily beta blocker along with simvastatin which will reduce to 20 mg daily and current dose of benazepril.  He has been a long-standing Imdur 120 mg daily, probably because of his ostial small vessel disease.  At this point I would simply continue his current regimen.

## 2013-06-02 NOTE — Assessment & Plan Note (Addendum)
A lot of this edema may very well be dependent, and related to his worsening dementia.  Because it does appear to be uncomfortable and he has a blister in his leg, we'll start him on low dose of Lasix at 20 mg daily.  Take any more than this would be very difficult for the wife care for, as he is somewhat incontinent.  Will start off for a couple days of 1 tablet every day and then go to every other day.

## 2013-06-02 NOTE — Progress Notes (Signed)
Patient ID: Jake Rollins, male   DOB: 1934/07/28, 79 y.o.   MRN: [id number] PCP: Willey Blade, MD  Clinic Note: Chief Complaint  Patient presents with  . 8 month visit     no chest ,sob with activity ADL'S,no verbal communication,edema,blister on his left leg healing now   HPI: Jake Rollins is a 79 y.o. male with a moderate CAD and other risk factors to has not progressively worsening dementia.  He presents today for routine followup.  I last saw him about 8 months ago when the major complaint he had was right leg pain that was evaluated with mercury Dopplers that only demonstrated a mild distal abdominal aortic and left left iliac aneurysm.  Interval History: Today on presentation, she still given by the wife.  The most notable symptoms are described above.  He does not have any chest pain with rest or exertion, but his wife does note that he does have mild shortness of breath with his activities of daily living.  Most notably, he gets somewhat short of breath while in the shower.  He likes a hot shower.  He has a healing blister on the left leg that is getting wound care for his bilateral leg swelling.  Edema is moderate, but uncomfortable.  Unfortunately I really don't know what medications he is taking.  The medication list is not accurate, and the wife was not sure.  He supposed to be on 10 mg of enalapril at 50 mm of Pletal (which we can stop), metoprolol tartrate which he was taking 25 mg a day along with Zocor 40 mg daily.  I'm not sure she's taking these or not.  He is not active, doesn't do any exercise.  There is no PND, orthopnea.  No obvious dizziness or wooziness.  No syncope he does have some mild orthostatic symptoms this wife senses.  No TIA or amaurosis fugax symptoms, although she is not sure what is decreased localization of this partly be a stroke or is this simply is worsening dementia. No melena, hematochezia or hematuria.  The pain in the right leg seems to have improved.  Past  Medical History  Diagnosis Date  . 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 Hypertension   . Coronary artery disease 2008     LAD of roughly 40% and 50%; small (<20mm) D2 - 90% ostial; small OM3 - ostial 90%  . Alzheimer disease     Moderate dementia  . Dyslipidemia   . Diabetic peripheral neuropathy associated with type 2 diabetes mellitus   . CKD (chronic kidney disease), stage III   . AAA (abdominal aortic aneurysm) without rupture     Mild infrarenal to play with left common iliac ears and    Prior Cardiac Evaluation and Past Surgical History: Past Surgical History  Procedure Laterality Date  . Cardiac catheterization  2008    40-50% LAD lesions, 90% ostial D2 (less than 2 mm) 80% ostial OM 3 (less than 2 mm)  . Nm myoview ltd  February 2010    No ischemia or infarction  . Doppler echocardiography  April 2009    Borderline concentric LVH, otherwise normal.  . Lower extremity artery dopplers      AAA  minimal dilation just proximal to the iliac bifurcation measuring 3.3 x 3.45 cm.  Left common iliac was aneurysmal dilatation measuring 2.57-2.7 cm; ABIs: 017) 1.0 left.  Bilateral arteries with 3 vessel runoff  Allergies  Allergen Reactions  . Demerol (Meperidine)     Current Outpatient Prescriptions  Medication Sig Dispense Refill  . aspirin 81 MG tablet Take 81 mg by mouth daily.      . benazepril (LOTENSIN) 10 MG tablet Take 10 mg by mouth daily.      . cilostazol (PLETAL) 50 MG tablet Take 50 mg by mouth 2 (two) times daily.      . cloNIDine (CATAPRES) 0.1 MG tablet Take 0.1 mg by mouth 2 (two) times daily.      Marland Kitchen gabapentin (NEURONTIN) 100 MG capsule Take 100 mg by mouth daily.      Marland Kitchen glimepiride (AMARYL) 1 MG tablet Take 1 mg by mouth daily before breakfast.      . isosorbide mononitrate (IMDUR) 120 MG 24 hr tablet Take 120 mg by mouth daily.      . metoprolol succinate (TOPROL XL) 25 MG 24 hr tablet Take 1 tablet (25 mg  total) by mouth daily.  30 tablet  11  . nitroGLYCERIN (NITROSTAT) 0.4 MG SL tablet Place 1 tablet (0.4 mg total) under the tongue every 5 (five) minutes as needed for chest pain.  25 tablet  6  . simvastatin (ZOCOR) 40 MG tablet Take 40 mg by mouth every evening.      . sitaGLIPtan-metformin (JANUMET) 50-500 MG per tablet Take 1 tablet by mouth daily with breakfast.      . furosemide (LASIX) 20 MG tablet Take 2 tablets (40 mg total) by mouth every other day. As needed for venous leg swelling  30 tablet  11   No current facility-administered medications for this visit.    History   Social History  . Marital Status: Married    Spouse Name: N/A    Number of Children: N/A  . Years of Education: N/A   Occupational History  . Not on file.   Social History Main Topics  . Smoking status: Former Smoker    Quit date: 11/16/1992  . Smokeless tobacco: Not on file  . Alcohol Use: No  . Drug Use: No  . Sexually Active: Not on file   Other Topics Concern  . Not on file   Social History Narrative    He is married. Father of 3, grandfather of 6, great-grandfather of 4. Most of the history is provided by his wife, but today he was a little bit more talkative and I got more out of him as far as his symptoms from his foot. He does not really get any routine exercise. Does not smoke. Does not drink.    His wife is quite concerned about her limited to care for him with his ongoing dementia she is definitely seeking assistance with potential placement in a skilled nursing facility with memory unit.    ROS: A comprehensive Review of Systems - Negative except Pertinent positives noted above. His dementia is worsening, and is being coming less verbal..  He does have mild orthostasis and a blister that is healing on his left leg.  PHYSICAL EXAM BP 150/80  Pulse 74  Ht 6\' 2"  (1.88 m)  Wt 201 lb 4.8 oz (91.309 kg)  BMI 25.83 kg/m2 Gen: he is a pleasant gentleman;  awake and alert, oriented to  himself and his wife who may respond to more than he does to me. He is  He is not very forthcoming with data, but he is able to answer questions that are simple and directed to him. He is in no  acute distress.  HEENT: [place]/AT. EOMI. MMM. Sclerae are clear with arcus senilis.  Neck: Supple. No LA/JVD or carotid bruit.  Lungs:  CTA B. Nonlabored. Normal air movement.  Heart: RRR. Normal S1, S2. No M/R/G.  Abdomen: Soft. NT/ND. No HSM.  Extremities: No clubbing, cyanosis or edema. The right anterior tibial artery is difficult to palpate  MWU:[id number] today: Yes Rate: 74 , Rhythm: Sinus rhythm with first-degree AV block, otherwise normal; Recent Labs: None  ASSESSMENT / PLAN:   CAD (coronary artery disease) He does not seem having any significant symptoms consistent with angina.  At this point, short of him having acute coronary syndrome, myofascial to proceed with medical therapy.  Plan: Continue aspirin, once daily beta blocker along with simvastatin which will reduce to 20 mg daily and current dose of benazepril.  He has been a long-standing Imdur 120 mg daily, probably because of his ostial small vessel disease.  At this point I would simply continue his current regimen.  HTN (hypertension) He has a little elevated blood pressure today, however with him having some mild orthostatic symptoms, I'm reluctant to be on aggressive in treating it.  I considered increasing his ACE inhibitor l, but I'm not really sure what he is actually taking.  Plan: I will defer treatment of his blood pressure to his primary care provider who is likely to see more frequently than me.  Would just simply need to confirm what medications he is on, but would prefer to not use clonidine if possible.  AAA (abdominal aortic aneurysm) without rupture Aneurysm is relatively small, will likely need to reassess by next year's visit.  Dyslipidemia Just to avoid potential concern with myalgias, I would reduce his simvastatin  20 mg daily, or switch to a different statin.  Alzheimer's dementia-moderate This seems to be the major problem for the patient.  His wife is very concerned and I think she needs some help.  I think she needs some help with reading I have given him placed in a nursing facility.  I'm not sure 30 outpatient social workers available, the going to social services.  I recommended that she contact her primary physician to see if anything available locally.  Edema of both legs A lot of this edema may very well be dependent, and related to his worsening dementia.  Because it does appear to be uncomfortable and he has a blister in his leg, we'll start him on low dose of Lasix at 20 mg daily.  Take any more than this would be very difficult for the wife care for, as he is somewhat incontinent.  Will start off for a couple days of 1 tablet every day and then go to every other day.    Orders Placed This Encounter  Procedures  . EKG 12-Lead   Meds ordered this encounter  Medications  . sitaGLIPtan-metformin (JANUMET) 50-500 MG per tablet    Sig: Take 1 tablet by mouth daily with breakfast.  . benazepril (LOTENSIN) 10 MG tablet    Sig: Take 10 mg by mouth daily.  . cilostazol (PLETAL) 50 MG tablet    Sig: Take 50 mg by mouth 2 (two) times daily.  . furosemide (LASIX) 20 MG tablet    Sig: Take 2 tablets (40 mg total) by mouth every other day. As needed for venous leg swelling    Dispense:  30 tablet    Refill:  11  . nitroGLYCERIN (NITROSTAT) 0.4 MG SL tablet    Sig: Place 1 tablet (  0.4 mg total) under the tongue every 5 (five) minutes as needed for chest pain.    Dispense:  25 tablet    Refill:  6  . metoprolol succinate (TOPROL XL) 25 MG 24 hr tablet    Sig: Take 1 tablet (25 mg total) by mouth daily.    Dispense:  30 tablet    Refill:  11    Followup: One year  DAVID W. Herbie Baltimore, M.D., M.S. [facility] [address] [place], [place]   [address]  [phone] Pager # [phone]

## 2013-06-02 NOTE — Assessment & Plan Note (Signed)
He has a little elevated blood pressure today, however with him having some mild orthostatic symptoms, I'm reluctant to be on aggressive in treating it.  I considered increasing his ACE inhibitor l, but I'm not really sure what he is actually taking.  Plan: I will defer treatment of his blood pressure to his primary care provider who is likely to see more frequently than me.  Would just simply need to confirm what medications he is on, but would prefer to not use clonidine if possible.

## 2013-06-02 NOTE — Assessment & Plan Note (Signed)
Just to avoid potential concern with myalgias, I would reduce his simvastatin 20 mg daily, or switch to a different statin.

## 2013-06-02 NOTE — Assessment & Plan Note (Signed)
Aneurysm is relatively small, will likely need to reassess by next year's visit.

## 2013-07-02 ENCOUNTER — Other Ambulatory Visit: Payer: Self-pay | Admitting: Cardiology

## 2013-07-04 NOTE — Telephone Encounter (Signed)
Rx was sent to pharmacy electronically. 

## 2013-07-29 ENCOUNTER — Other Ambulatory Visit: Payer: Self-pay | Admitting: Cardiology

## 2013-07-29 NOTE — Telephone Encounter (Signed)
Rx was sent to pharmacy electronically. 

## 2013-08-02 ENCOUNTER — Encounter: Payer: Self-pay | Admitting: Cardiology

## 2013-08-02 ENCOUNTER — Ambulatory Visit (INDEPENDENT_AMBULATORY_CARE_PROVIDER_SITE_OTHER): Payer: Medicare Other | Admitting: Cardiology

## 2013-08-02 VITALS — BP 118/64 | HR 73 | Ht 73.0 in | Wt 188.4 lb

## 2013-08-02 DIAGNOSIS — E785 Hyperlipidemia, unspecified: Secondary | ICD-10-CM

## 2013-08-02 DIAGNOSIS — R600 Localized edema: Secondary | ICD-10-CM

## 2013-08-02 DIAGNOSIS — F0280 Dementia in other diseases classified elsewhere without behavioral disturbance: Secondary | ICD-10-CM

## 2013-08-02 DIAGNOSIS — I2510 Atherosclerotic heart disease of native coronary artery without angina pectoris: Secondary | ICD-10-CM

## 2013-08-02 DIAGNOSIS — I10 Essential (primary) hypertension: Secondary | ICD-10-CM

## 2013-08-02 DIAGNOSIS — R609 Edema, unspecified: Secondary | ICD-10-CM

## 2013-08-02 NOTE — Patient Instructions (Signed)
I am happy to see that the swelling has improved with the fluid pill (furosemide). If you notice that it is getting worse, it is ok to give it every day for a few days until it improves.    Marykay Lex, MD  Your physician wants you to follow-up in: 12 months. You will receive a reminder letter in the mail two months in advance. If you don't receive a letter, please call our office to schedule the follow-up appointment. Please do not hesitate to call with questions.  We will be happy to work you in if needed.  Marykay Lex, MD

## 2013-08-03 ENCOUNTER — Encounter: Payer: Self-pay | Admitting: Cardiology

## 2013-08-03 NOTE — Assessment & Plan Note (Signed)
Again recommend that she seek placement for him in a long-term care facility. Preferably, one with a memory unit.

## 2013-08-03 NOTE — Assessment & Plan Note (Signed)
On statin at the reduced dose. At this point I think the main reason for monitoring would be to evaluate LFTs. Recheck in one year.

## 2013-08-03 NOTE — Progress Notes (Signed)
Patient ID: Jake Rollins, male   DOB: 09-22-1934, 79 y.o.   MRN: [id number] PCP: Willey Blade, MD  Clinic Note: Chief Complaint  Patient presents with  . Annual Exam    Fatigue, pain in both knees and both feet, feet and ankle edema.   HPI: Jake Rollins is a 79 y.o. male with a moderate CAD and other risk factors to has not progressively worsening dementia.  He presents today for was actually his scheduled annual followup. I last saw him in July of this year.  Interval History: Again he comes in today he, not really providing much the way of any history. Anything pertinent and relevant is provided by his wife.  The most notable symptoms are described above.  She is not aware that he is having any chest pain with rest or exertion, but she does suggest a hint of mild shortness of breath with his activities of daily living, such as while in the shower.  He likes a hot shower, but she is actually turn the temperature down some to avoid this.   His edema has gotten much better with the Lasix, and thus allow his leg he blister to heal.    He is not active, and doesn't do any exercise.  There is no PND, orthopnea.  No obvious dizziness or wooziness.  No syncope he does have some mild orthostatic symptoms this wife senses.  No TIA or amaurosis fugax symptoms, although she is not sure what is decreased localization of this partly be a stroke or is this simply is worsening dementia. No melena, hematochezia or hematuria.  He still occasionally complains of pain in his feet and legs, but improved with decreased edema.  Past Medical History  Diagnosis Date  . 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 Hypertension   . Coronary artery disease 2008     LAD of roughly 40% and 50%; small (<58mm) D2 - 90% ostial; small OM3 - ostial 90%  . Alzheimer disease     Moderate dementia  . Dyslipidemia   . Diabetic peripheral neuropathy associated with type 2 diabetes mellitus   .  CKD (chronic kidney disease), stage III   . AAA (abdominal aortic aneurysm) without rupture     Mild infrarenal to play with left common iliac ears and    Prior Cardiac Evaluation and Past Surgical History: Past Surgical History  Procedure Laterality Date  . Cardiac catheterization  2008    40-50% LAD lesions, 90% ostial D2 (less than 2 mm) 80% ostial OM 3 (less than 2 mm)  . Nm myoview ltd  February 2010    No ischemia or infarction  . Doppler echocardiography  April 2009    Borderline concentric LVH, otherwise normal.  . Lower extremity artery dopplers      AAA  minimal dilation just proximal to the iliac bifurcation measuring 3.3 x 3.45 cm.  Left common iliac was aneurysmal dilatation measuring 2.57-2.7 cm; ABIs: 017) 1.0 left.  Bilateral arteries with 3 vessel runoff     Allergies  Allergen Reactions  . Demerol [Meperidine]     Current Outpatient Prescriptions  Medication Sig Dispense Refill  . aspirin 81 MG tablet Take 81 mg by mouth daily.      . benazepril (LOTENSIN) 10 MG tablet Take 10 mg by mouth daily.      . cilostazol (PLETAL) 50 MG tablet TAKE 1 TABLET TWICE DAILY.  60 tablet  10  .  cloNIDine (CATAPRES) 0.1 MG tablet Take 0.1 mg by mouth 2 (two) times daily.      . furosemide (LASIX) 20 MG tablet Take 2 tablets (40 mg total) by mouth every other day. As needed for venous leg swelling  30 tablet  11  . gabapentin (NEURONTIN) 100 MG capsule Take 100 mg by mouth daily.      Marland Kitchen glimepiride (AMARYL) 1 MG tablet Take 1 mg by mouth daily before breakfast.      . isosorbide mononitrate (IMDUR) 120 MG 24 hr tablet Take 120 mg by mouth daily.      . metoprolol succinate (TOPROL XL) 25 MG 24 hr tablet Take 1 tablet (25 mg total) by mouth daily.  30 tablet  11  . nitroGLYCERIN (NITROSTAT) 0.4 MG SL tablet Place 1 tablet (0.4 mg total) under the tongue every 5 (five) minutes as needed for chest pain.  25 tablet  6  . simvastatin (ZOCOR) 40 MG tablet TAKE 1 TABLET BY MOUTH AT  BEDTIME  30 tablet  11  . sitaGLIPtan-metformin (JANUMET) 50-500 MG per tablet Take 1 tablet by mouth daily with breakfast.       No current facility-administered medications for this visit.    History   Social History  . Marital Status: Married    Spouse Name: N/A    Number of Children: N/A  . Years of Education: N/A   Occupational History  . Not on file.   Social History Main Topics  . Smoking status: Former Smoker    Quit date: 11/16/1992  . Smokeless tobacco: Not on file  . Alcohol Use: No  . Drug Use: No  . Sexual Activity: Not on file   Other Topics Concern  . Not on file   Social History Narrative    He is married. Father of 3, grandfather of 6, great-grandfather of 4. Most of the history is provided by his wife, but today he was a little bit more talkative and I got more out of him as far as his symptoms from his foot. He does not really get any routine exercise. Does not smoke. Does not drink.    His wife is quite concerned about her limited to care for him with his ongoing dementia she is definitely seeking assistance with potential placement in a skilled nursing facility with memory unit.    ROS: A comprehensive Review of Systems - Negative except Pertinent positives noted above. His dementia is worsening, and is being coming less verbal..  He does have mild orthostasis and a healing blister on his left leg.  PHYSICAL EXAM BP 118/64  Pulse 73  Ht 6\' 1"  (1.854 m)  Wt 188 lb 6.4 oz (85.458 kg)  BMI 24.86 kg/m2 Gen: Pleasant, awake and alert, oriented to himself and his wife who may respond to more than he does to me. He is not very forthcoming with data, but he is able to answer questions that are simple and directed to him. He is in no acute distress.  HEENT: [place]/AT. EOMI. MMM. Sclerae are clear with arcus senilis.  Neck: Supple. No LA/JVD or carotid bruit.  Lungs:  CTA B. Nonlabored. Normal air movement.  Heart: RRR. Normal S1, S2. No M/R/G.  Abdomen: Soft.  NT/ND. No HSM.  Extremities: No clubbing, cyanosis or edema. The right anterior tibial artery is difficult to palpate  AVW:[id number] today: Yes Rate: 73, Rhythm: Sinus rhythm with first-degree AV block, otherwise normal;  Recent Labs: None  ASSESSMENT / PLAN:  CAD (coronary artery disease) No active symptoms of angina. We reiterated D. wife desire to not pursue any aggressive invasive evaluation. With that in mind we will do noninvasive tests as well. We will treat acute symptoms appropriately, but wouldn't strongly consider a noninvasive approach. Plan: Continue current medication regimen.  HTN (hypertension)  Blood pressure is much better today at the last visit. Again I would be more inclined to tolerate progressive hypertension.  Plan: Continue current dose of beta blocker and ACE inhibitor. We'll see if we could potentially stop the clonidine, however at this site is relatively stable will continue on current regimen.  Dyslipidemia On statin at the reduced dose. At this point I think the main reason for monitoring would be to evaluate LFTs. Recheck in one year.  Edema of both legs Notably improved on current dose of furosemide every other day. If this informed her that she can get an a daily dose for several days of his swelling is worse with eating a lot more weight or his symptoms of dyspnea or worsening.  Alzheimer's dementia-moderate Again recommend that she seek placement for him in a long-term care facility. Preferably, one with a memory unit.    No orders of the defined types were placed in this encounter.   No orders of the defined types were placed in this encounter.    Dispo: Again long talk with the wife about his overall mental health and her ability to care for him at back at home by herself. She fully not is that he is becoming more than she can handle, but really has no recourse. As was the case last saw Lynnea Ferrier she was using a hold of the social services to  see if any way to get into a skilled nursing facility for dementia patients.  Followup: One year  Skya Mccullum W. Herbie Baltimore, M.D., M.S. [facility] [address] [place], [place]  [address]  [phone] Pager # [phone]

## 2013-08-03 NOTE — Assessment & Plan Note (Signed)
No active symptoms of angina. We reiterated D. wife desire to not pursue any aggressive invasive evaluation. With that in mind we will do noninvasive tests as well. We will treat acute symptoms appropriately, but wouldn't strongly consider a noninvasive approach. Plan: Continue current medication regimen.

## 2013-08-03 NOTE — Assessment & Plan Note (Signed)
Blood pressure is much better today at the last visit. Again I would be more inclined to tolerate progressive hypertension.  Plan: Continue current dose of beta blocker and ACE inhibitor. We'll see if we could potentially stop the clonidine, however at this site is relatively stable will continue on current regimen.

## 2013-08-03 NOTE — Assessment & Plan Note (Signed)
Notably improved on current dose of furosemide every other day. If this informed her that she can get an a daily dose for several days of his swelling is worse with eating a lot more weight or his symptoms of dyspnea or worsening.

## 2013-11-29 ENCOUNTER — Other Ambulatory Visit (HOSPITAL_COMMUNITY): Payer: Self-pay | Admitting: Cardiology

## 2013-11-29 DIAGNOSIS — I714 Abdominal aortic aneurysm, without rupture, unspecified: Secondary | ICD-10-CM

## 2013-12-08 ENCOUNTER — Encounter (HOSPITAL_COMMUNITY): Payer: Medicare Other

## 2014-07-04 ENCOUNTER — Inpatient Hospital Stay (HOSPITAL_COMMUNITY)
Admission: EM | Admit: 2014-07-04 | Discharge: 2014-07-11 | DRG: 871 | Disposition: A | Discharge status: Skilled Nursing Facility | Payer: Medicare (Managed Care) | Attending: Internal Medicine | Admitting: Internal Medicine

## 2014-07-04 ENCOUNTER — Encounter (HOSPITAL_COMMUNITY): Payer: Self-pay | Admitting: Emergency Medicine

## 2014-07-04 ENCOUNTER — Emergency Department (HOSPITAL_COMMUNITY): Payer: Medicare (Managed Care)

## 2014-07-04 DIAGNOSIS — E785 Hyperlipidemia, unspecified: Secondary | ICD-10-CM | POA: Diagnosis present

## 2014-07-04 DIAGNOSIS — I9589 Other hypotension: Secondary | ICD-10-CM | POA: Diagnosis present

## 2014-07-04 DIAGNOSIS — F0280 Dementia in other diseases classified elsewhere without behavioral disturbance: Secondary | ICD-10-CM

## 2014-07-04 DIAGNOSIS — Z7982 Long term (current) use of aspirin: Secondary | ICD-10-CM | POA: Diagnosis not present

## 2014-07-04 DIAGNOSIS — E1100 Type 2 diabetes mellitus with hyperosmolarity without nonketotic hyperglycemic-hyperosmolar coma (NKHHC): Secondary | ICD-10-CM

## 2014-07-04 DIAGNOSIS — N390 Urinary tract infection, site not specified: Secondary | ICD-10-CM | POA: Diagnosis present

## 2014-07-04 DIAGNOSIS — I2510 Atherosclerotic heart disease of native coronary artery without angina pectoris: Secondary | ICD-10-CM | POA: Diagnosis present

## 2014-07-04 DIAGNOSIS — G9340 Encephalopathy, unspecified: Secondary | ICD-10-CM | POA: Diagnosis present

## 2014-07-04 DIAGNOSIS — I129 Hypertensive chronic kidney disease with stage 1 through stage 4 chronic kidney disease, or unspecified chronic kidney disease: Secondary | ICD-10-CM | POA: Diagnosis present

## 2014-07-04 DIAGNOSIS — N184 Chronic kidney disease, stage 4 (severe): Secondary | ICD-10-CM

## 2014-07-04 DIAGNOSIS — I252 Old myocardial infarction: Secondary | ICD-10-CM | POA: Diagnosis not present

## 2014-07-04 DIAGNOSIS — N179 Acute kidney failure, unspecified: Secondary | ICD-10-CM

## 2014-07-04 DIAGNOSIS — A419 Sepsis, unspecified organism: Secondary | ICD-10-CM | POA: Diagnosis present

## 2014-07-04 DIAGNOSIS — I10 Essential (primary) hypertension: Secondary | ICD-10-CM

## 2014-07-04 DIAGNOSIS — I714 Abdominal aortic aneurysm, without rupture, unspecified: Secondary | ICD-10-CM

## 2014-07-04 DIAGNOSIS — E1142 Type 2 diabetes mellitus with diabetic polyneuropathy: Secondary | ICD-10-CM | POA: Diagnosis present

## 2014-07-04 DIAGNOSIS — R578 Other shock: Secondary | ICD-10-CM | POA: Diagnosis present

## 2014-07-04 DIAGNOSIS — E1149 Type 2 diabetes mellitus with other diabetic neurological complication: Secondary | ICD-10-CM | POA: Diagnosis present

## 2014-07-04 DIAGNOSIS — G309 Alzheimer's disease, unspecified: Secondary | ICD-10-CM | POA: Diagnosis present

## 2014-07-04 DIAGNOSIS — D696 Thrombocytopenia, unspecified: Secondary | ICD-10-CM | POA: Diagnosis present

## 2014-07-04 DIAGNOSIS — R739 Hyperglycemia, unspecified: Secondary | ICD-10-CM

## 2014-07-04 DIAGNOSIS — E872 Acidosis, unspecified: Secondary | ICD-10-CM | POA: Diagnosis present

## 2014-07-04 DIAGNOSIS — R579 Shock, unspecified: Secondary | ICD-10-CM

## 2014-07-04 DIAGNOSIS — E870 Hyperosmolality and hypernatremia: Secondary | ICD-10-CM | POA: Diagnosis present

## 2014-07-04 DIAGNOSIS — Z87891 Personal history of nicotine dependence: Secondary | ICD-10-CM | POA: Diagnosis not present

## 2014-07-04 DIAGNOSIS — R1310 Dysphagia, unspecified: Secondary | ICD-10-CM | POA: Diagnosis present

## 2014-07-04 DIAGNOSIS — W19XXXA Unspecified fall, initial encounter: Secondary | ICD-10-CM | POA: Diagnosis present

## 2014-07-04 DIAGNOSIS — Z66 Do not resuscitate: Secondary | ICD-10-CM | POA: Diagnosis present

## 2014-07-04 DIAGNOSIS — D638 Anemia in other chronic diseases classified elsewhere: Secondary | ICD-10-CM | POA: Diagnosis present

## 2014-07-04 DIAGNOSIS — E1101 Type 2 diabetes mellitus with hyperosmolarity with coma: Secondary | ICD-10-CM | POA: Diagnosis present

## 2014-07-04 DIAGNOSIS — R4182 Altered mental status, unspecified: Secondary | ICD-10-CM

## 2014-07-04 DIAGNOSIS — R600 Localized edema: Secondary | ICD-10-CM

## 2014-07-04 DIAGNOSIS — F0390 Unspecified dementia without behavioral disturbance: Secondary | ICD-10-CM

## 2014-07-04 LAB — CBC
HEMATOCRIT: 38.5 % — AB (ref 39.0–52.0)
HEMATOCRIT: 34.8 % — AB (ref 39.0–52.0)
HEMOGLOBIN: 11.8 g/dL — AB (ref 13.0–17.0)
HEMOGLOBIN: 10.8 g/dL — AB (ref 13.0–17.0)
MCH: 29.8 pg (ref 26.0–34.0)
MCH: 30.3 pg (ref 26.0–34.0)
MCHC: 30.6 g/dL (ref 30.0–36.0)
MCHC: 31 g/dL (ref 30.0–36.0)
MCV: 97.2 fL (ref 78.0–100.0)
MCV: 97.5 fL (ref 78.0–100.0)
Platelets: 74 10*3/uL — ABNORMAL LOW (ref 150–400)
Platelets: 65 10*3/uL — ABNORMAL LOW (ref 150–400)
RBC: 3.96 MIL/uL — AB (ref 4.22–5.81)
RBC: 3.57 MIL/uL — ABNORMAL LOW (ref 4.22–5.81)
RDW: 15.3 % (ref 11.5–15.5)
RDW: 15.2 % (ref 11.5–15.5)
WBC: 9.7 10*3/uL (ref 4.0–10.5)
WBC: 8 10*3/uL (ref 4.0–10.5)

## 2014-07-04 LAB — URINALYSIS, ROUTINE W REFLEX MICROSCOPIC
BILIRUBIN URINE: NEGATIVE
Glucose, UA: 100 mg/dL — AB
KETONES UR: NEGATIVE mg/dL
NITRITE: NEGATIVE
PH: 6 (ref 5.0–8.0)
Protein, ur: >300 mg/dL — AB
Specific Gravity, Urine: 1.023 (ref 1.005–1.030)
Urobilinogen, UA: 0.2 mg/dL (ref 0.0–1.0)

## 2014-07-04 LAB — TROPONIN I
Troponin I: <0.3 ng/mL (ref <0.30)
Troponin I: <0.3 ng/mL (ref <0.30)

## 2014-07-04 LAB — BASIC METABOLIC PANEL
BUN: 111 mg/dL — ABNORMAL HIGH (ref 6–23)
CO2: 21 mEq/L (ref 19–32)
Calcium: 8.1 mg/dL — ABNORMAL LOW (ref 8.4–10.5)
Chloride: >130 mEq/L (ref 96–112)
Creatinine, Ser: 4.65 mg/dL — ABNORMAL HIGH (ref 0.50–1.35)
GFR calc Af Amer: 13 mL/min — ABNORMAL LOW (ref >90)
GFR calc non Af Amer: 11 mL/min — ABNORMAL LOW (ref >90)
GLUCOSE: 94 mg/dL (ref 70–99)
POTASSIUM: 4 meq/L (ref 3.7–5.3)
Sodium: 170 mEq/L (ref 137–147)

## 2014-07-04 LAB — COMPREHENSIVE METABOLIC PANEL
ALBUMIN: 2.8 g/dL — AB (ref 3.5–5.2)
ALK PHOS: 91 U/L (ref 39–117)
ALT: 13 U/L (ref 0–53)
ANION GAP: 19 — AB (ref 5–15)
AST: 13 U/L (ref 0–37)
BILIRUBIN TOTAL: 0.4 mg/dL (ref 0.3–1.2)
BUN: 118 mg/dL — AB (ref 6–23)
CHLORIDE: 127 meq/L — AB (ref 96–112)
CO2: 21 mEq/L (ref 19–32)
Calcium: 9.3 mg/dL (ref 8.4–10.5)
Creatinine, Ser: 5.21 mg/dL — ABNORMAL HIGH (ref 0.50–1.35)
GFR calc non Af Amer: 10 mL/min — ABNORMAL LOW (ref >90)
GFR, EST AFRICAN AMERICAN: 11 mL/min — AB (ref >90)
GLUCOSE: 534 mg/dL — AB (ref 70–99)
POTASSIUM: 5.1 meq/L (ref 3.7–5.3)
Sodium: 167 mEq/L (ref 137–147)
Total Protein: 7 g/dL (ref 6.0–8.3)

## 2014-07-04 LAB — CREATININE, SERUM
Creatinine, Ser: 4.93 mg/dL — ABNORMAL HIGH (ref 0.50–1.35)
GFR, EST AFRICAN AMERICAN: 12 mL/min — AB (ref >90)
GFR, EST NON AFRICAN AMERICAN: 10 mL/min — AB (ref >90)

## 2014-07-04 LAB — CBG MONITORING, ED
Glucose-Capillary: 412 mg/dL — ABNORMAL HIGH (ref 70–99)
Glucose-Capillary: 312 mg/dL — ABNORMAL HIGH (ref 70–99)

## 2014-07-04 LAB — URINE MICROSCOPIC-ADD ON

## 2014-07-04 LAB — MRSA PCR SCREENING: MRSA by PCR: NEGATIVE

## 2014-07-04 LAB — GLUCOSE, CAPILLARY
Glucose-Capillary: 164 mg/dL — ABNORMAL HIGH (ref 70–99)
Glucose-Capillary: 104 mg/dL — ABNORMAL HIGH (ref 70–99)

## 2014-07-04 LAB — MAGNESIUM: Magnesium: 3.2 mg/dL — ABNORMAL HIGH (ref 1.5–2.5)

## 2014-07-04 LAB — LACTIC ACID, PLASMA: Lactic Acid, Venous: 4.1 mmol/L — ABNORMAL HIGH (ref 0.5–2.2)

## 2014-07-04 LAB — TSH: TSH: 0.701 u[IU]/mL (ref 0.350–4.500)

## 2014-07-04 MED ORDER — SODIUM CHLORIDE 0.9 % IV SOLN: INTRAVENOUS | Status: DC

## 2014-07-04 MED ORDER — SODIUM CHLORIDE 0.9 % IV SOLN
Freq: Once | INTRAVENOUS | Status: AC
  Administered 2014-07-04: 1000 mL via INTRAVENOUS

## 2014-07-04 MED ORDER — SODIUM CHLORIDE 0.9 % IV BOLUS (SEPSIS)
1000.0000 mL | Freq: Once | INTRAVENOUS | Status: AC
  Administered 2014-07-04: 1000 mL via INTRAVENOUS

## 2014-07-04 MED ORDER — ONDANSETRON HCL 4 MG PO TABS: 4.0000 mg | ORAL_TABLET | Freq: Four times a day (QID) | ORAL | Status: DC | PRN

## 2014-07-04 MED ORDER — DEXTROSE 5 % IV SOLN
1.0000 g | Freq: Once | INTRAVENOUS | Status: AC
  Administered 2014-07-04: 1 g via INTRAVENOUS
  Filled 2014-07-04: qty 10

## 2014-07-04 MED ORDER — SODIUM CHLORIDE 0.9 % IJ SOLN: 3.0000 mL | Freq: Two times a day (BID) | INTRAMUSCULAR | Status: DC

## 2014-07-04 MED ORDER — INSULIN GLARGINE 100 UNIT/ML ~~LOC~~ SOLN
40.0000 [IU] | Freq: Every day | SUBCUTANEOUS | Status: DC
  Administered 2014-07-04: 40 [IU] via SUBCUTANEOUS
  Filled 2014-07-04 (×2): qty 0.4

## 2014-07-04 MED ORDER — ACETAMINOPHEN 650 MG RE SUPP: 650.0000 mg | Freq: Four times a day (QID) | RECTAL | Status: DC | PRN

## 2014-07-04 MED ORDER — ACETAMINOPHEN 325 MG PO TABS
650.0000 mg | ORAL_TABLET | Freq: Four times a day (QID) | ORAL | Status: DC | PRN
Start: 2014-07-04 — End: 2014-07-05

## 2014-07-04 MED ORDER — SODIUM CHLORIDE 0.45 % IV SOLN
INTRAVENOUS | Status: DC
  Administered 2014-07-04: 21:00:00 via INTRAVENOUS

## 2014-07-04 MED ORDER — INSULIN ASPART 100 UNIT/ML ~~LOC~~ SOLN
0.0000 [IU] | SUBCUTANEOUS | Status: DC
  Administered 2014-07-04: 2 [IU] via SUBCUTANEOUS
  Administered 2014-07-04: 7 [IU] via SUBCUTANEOUS
  Administered 2014-07-05: 1 [IU] via SUBCUTANEOUS
  Filled 2014-07-04: qty 1

## 2014-07-04 MED ORDER — LEVALBUTEROL HCL 0.63 MG/3ML IN NEBU
0.6300 mg | INHALATION_SOLUTION | Freq: Four times a day (QID) | RESPIRATORY_TRACT | Status: DC
  Administered 2014-07-05 – 2014-07-06 (×6): 0.63 mg via RESPIRATORY_TRACT
  Filled 2014-07-04 (×17): qty 3

## 2014-07-04 MED ORDER — ONDANSETRON HCL 4 MG/2ML IJ SOLN: 4.0000 mg | Freq: Four times a day (QID) | INTRAMUSCULAR | Status: DC | PRN

## 2014-07-04 MED ORDER — SODIUM CHLORIDE 0.9 % IV BOLUS (SEPSIS): 1000.0000 mL | Freq: Once | INTRAVENOUS | Status: AC

## 2014-07-04 MED ORDER — ENOXAPARIN SODIUM 30 MG/0.3ML ~~LOC~~ SOLN
30.0000 mg | SUBCUTANEOUS | Status: DC
  Administered 2014-07-04: 30 mg via SUBCUTANEOUS
  Filled 2014-07-04 (×2): qty 0.3

## 2014-07-04 MED ORDER — INSULIN ASPART 100 UNIT/ML ~~LOC~~ SOLN
14.0000 [IU] | Freq: Once | SUBCUTANEOUS | Status: AC
  Administered 2014-07-04: 14 [IU] via INTRAVENOUS
  Filled 2014-07-04: qty 1

## 2014-07-04 MED ORDER — DEXTROSE 5 % IV SOLN
1.0000 g | INTRAVENOUS | Status: DC
  Administered 2014-07-04: 1 g via INTRAVENOUS
  Filled 2014-07-04 (×2): qty 1

## 2014-07-04 NOTE — Progress Notes (Signed)
ANTIBIOTIC CONSULT NOTE - INITIAL  Pharmacy Consult for Cefepime Indication: UTI  Allergies  Allergen Reactions  . Demerol [Meperidine]     Patient Measurements:    Vital Signs: Temp: 96.2 F (35.7 C) (09/08 1357) Temp src: Temporal (09/08 1357) BP: 108/65 mmHg (09/08 1537) Pulse Rate: 60 (09/08 1445) Intake/Output from previous day:   Intake/Output from this shift:    Labs:  Recent Labs  07/04/14 1428  WBC 9.7  HGB 11.8*  PLT 74*  CREATININE 5.21*   The CrCl is unknown because both a height and weight (above a minimum accepted value) are required for this calculation. No results found for this basename: VANCOTROUGH, VANCOPEAK, VANCORANDOM, GENTTROUGH, GENTPEAK, GENTRANDOM, TOBRATROUGH, TOBRAPEAK, TOBRARND, AMIKACINPEAK, AMIKACINTROU, AMIKACIN,  in the last 72 hours   Microbiology: No results found for this or any previous visit (from the past 720 hour(s)).  Medical History: Past Medical History  Diagnosis Date  . 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 Hypertension   . Coronary artery disease 2008     LAD of roughly 40% and 50%; small (<21mm) D2 - 90% ostial; small OM3 - ostial 90%  . Alzheimer disease     Moderate dementia  . Dyslipidemia   . Diabetic peripheral neuropathy associated with type 2 diabetes mellitus   . CKD (chronic kidney disease), stage III   . AAA (abdominal aortic aneurysm) without rupture     Mild infrarenal to play with left common iliac ears and    Medications:   (Not in a hospital admission) Scheduled:  . enoxaparin (LOVENOX) injection  30 mg Subcutaneous Q24H  . insulin aspart  0-9 Units Subcutaneous 6 times per day  . insulin aspart  14 Units Intravenous Once  . insulin glargine  40 Units Subcutaneous Daily  . levalbuterol  0.63 mg Nebulization Q6H  . sodium chloride  3 mL Intravenous Q12H   Infusions:  . sodium chloride    . sodium chloride    . sodium chloride    . sodium  chloride     Assessment: 80yo male presents with AMS. Pt is hyperthermic at 96.2, WBC wnl, sCr 5.21. UA has small leukocytes, numerous WBC and many bacteria. Pharmacy was consulted to dose cefepime for UTI and rule out sepsis. Pt received one dose of CTX in ED, but is higher risk for resistance with dementia and residence at [facility].  Goal of Therapy:  Resolution of infection  Plan:  Start Cefepime 1g IV q24h Follow up culture results and renal function  Arlean Hopping. Newman Pies, PharmD Clinical Pharmacist Pager [phone] 07/04/2014,4:08 PM

## 2014-07-04 NOTE — ED Notes (Signed)
Admitting MD paged r/t CBG >400

## 2014-07-04 NOTE — ED Notes (Signed)
Attempted iv times 2 and unsuccessful.  Paged iv team.

## 2014-07-04 NOTE — ED Notes (Signed)
Attempted report 

## 2014-07-04 NOTE — ED Notes (Signed)
Pt temp 96.2 temporal and reported to dr. Denton Lank and warm blankets to patient.

## 2014-07-04 NOTE — Significant Event (Addendum)
Rapid Response Event Note Asked to see pt by primary RN for concerns of T=91.8 rectal, HR 40-52, SBP 66-75, & responsive to deep sternal rub only. Overview: Time Called: 2055 Arrival Time: 2057 Event Type: Neurologic;Cardiac;Hypotension  Initial Focused Assessment:  On assessment pt is hypotensive, bradycardic,  & obtunded. Rectal temp 91.8, HR 54, BP 75/47, RR 4-12 with shallow respirations though sats 100% on 2.5L.  Pupils 2 & sluggish.  Pt MAEE with withdrawal to painful stimulus. Paged Merdis Delay, NP & orders rcvd. Plan 1000cc NS bolus & barehugger.    Pt much more responsive after 250cc NS & BP improving.  Will cont to monitor. Interventions:   Event Summary: Name of Physician Notified: K. Schorr at  2113    at          [facility], Jake Rollins

## 2014-07-04 NOTE — ED Notes (Signed)
Admitting MD made aware of pt CBG, verbal order to recheck CBG in 10 mins & admin 10 units IV insulin if CBG >400

## 2014-07-04 NOTE — H&P (Signed)
[facility] History and Physical  Jake Rollins WUJ:[id number] DOB: 04/11/1934 DOA: 07/04/2014  Referring physician: ER PCP: Willey Blade, MD   Chief Complaint: Altered mental status  HPI:  80 year old male with a history of advanced Alzheimer's dementia, coronary artery disease status post MI in 2008, hypertension, AAA, chronic kidney disease stage IV, type 2 diabetes with neuropathy nephropathy constant ER for evaluation of altered mental status. The patient is on being more confused compared to his baseline with a reported fall. His CBG was elevated and was found to be greater than 400. Patient is highly confused and does not reveal any history. He was found to be aggressive with the nursing staff. Temperature upon presentation was 96.2. Concern for sepsis because of urinary tract infection, CBG 534, BUN 118, creatinine 5.21.     Review of Systems: negative for the following  Unable to perform ROS: Dementia  Constitutional: Negative for fever.  level 5 caveat, not verbally responsive (at baseline     Past Medical History  Diagnosis Date  . 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 Hypertension   . Coronary artery disease 2008     LAD of roughly 40% and 50%; small (<54mm) D2 - 90% ostial; small OM3 - ostial 90%  . Alzheimer disease     Moderate dementia  . Dyslipidemia   . Diabetic peripheral neuropathy associated with type 2 diabetes mellitus   . CKD (chronic kidney disease), stage III   . AAA (abdominal aortic aneurysm) without rupture     Mild infrarenal to play with left common iliac ears and     Past Surgical History  Procedure Laterality Date  . Cardiac catheterization  2008    40-50% LAD lesions, 90% ostial D2 (less than 2 mm) 80% ostial OM 3 (less than 2 mm)  . Nm myoview ltd  February 2010    No ischemia or infarction  . Doppler echocardiography  April 2009    Borderline concentric LVH, otherwise normal.  . Lower  extremity artery dopplers      AAA  minimal dilation just proximal to the iliac bifurcation measuring 3.3 x 3.45 cm.  Left common iliac was aneurysmal dilatation measuring 2.57-2.7 cm; ABIs: 017) 1.0 left.  Bilateral arteries with 3 vessel runoff       Social History:  reports that he quit smoking about 21 years ago. He does not have any smokeless tobacco history on file. He reports that he does not drink alcohol or use illicit drugs.    Allergies  Allergen Reactions  . Demerol [Meperidine]     Family History  Problem Relation Age of Onset  . CAD      Noncontributory     Prior to Admission medications   Medication Sig Start Date End Date Taking? Authorizing Provider  aspirin 81 MG tablet Take 81 mg by mouth daily.    Historical Provider, MD  benazepril (LOTENSIN) 10 MG tablet Take 10 mg by mouth daily.    Historical Provider, MD  cilostazol (PLETAL) 50 MG tablet TAKE 1 TABLET TWICE DAILY. 07/29/13   Marykay Lex, MD  cloNIDine (CATAPRES) 0.1 MG tablet Take 0.1 mg by mouth 2 (two) times daily.    Historical Provider, MD  furosemide (LASIX) 20 MG tablet Take 2 tablets (40 mg total) by mouth every other day. As needed for venous leg swelling 05/16/13   Marykay Lex, MD  gabapentin (NEURONTIN) 100 MG capsule Take 100 mg  by mouth daily.    Historical Provider, MD  glimepiride (AMARYL) 1 MG tablet Take 1 mg by mouth daily before breakfast.    Historical Provider, MD  isosorbide mononitrate (IMDUR) 120 MG 24 hr tablet Take 120 mg by mouth daily.    Historical Provider, MD  metoprolol succinate (TOPROL XL) 25 MG 24 hr tablet Take 1 tablet (25 mg total) by mouth daily. 05/16/13   Marykay Lex, MD  nitroGLYCERIN (NITROSTAT) 0.4 MG SL tablet Place 1 tablet (0.4 mg total) under the tongue every 5 (five) minutes as needed for chest pain. 05/16/13   Marykay Lex, MD  simvastatin (ZOCOR) 40 MG tablet TAKE 1 TABLET BY MOUTH AT BEDTIME 07/02/13   Marykay Lex, MD  sitaGLIPtan-metformin  (JANUMET) 50-500 MG per tablet Take 1 tablet by mouth daily with breakfast.    Historical Provider, MD     Physical Exam: Filed Vitals:   07/04/14 1433 07/04/14 1445 07/04/14 1515 07/04/14 1537  BP: 80/60 99/75 109/78 108/65  Pulse:  60    Temp:      TempSrc:      Resp:  34 13 23  SpO2:  100%  100%     Constitutional: Vital signs reviewed. Patient is a well-developed and well-nourished in no acute distress and cooperative with exam. Awake noncommunicative Head: Normocephalic and atraumatic  Ear: TM normal bilaterally  Mouth: no erythema or exudates, MMM  Eyes: PERRL, EOMI, conjunctivae normal, No scleral icterus.  Neck: Supple, Trachea midline normal ROM, No JVD, mass, thyromegaly, or carotid bruit present.  Cardiovascular: RRR, S1 normal, S2 normal, no MRG, pulses symmetric and intact bilaterally  Pulmonary/Chest: CTAB, no wheezes, rales, or rhonchi  Abdominal: Soft. Non-tender, non-distended, bowel sounds are normal, no masses, organomegaly, or guarding present.  GU: no CVA tenderness Musculoskeletal: No joint deformities, erythema, or stiffness, ROM full and no nontender Ext: no edema and no cyanosis, pulses palpable bilaterally (DP and PT)  Hematology: no cervical, inginal, or axillary adenopathy.  Neurological: Neurological: He is alert.  Appears alert, content.  Not verbally responsive/at baseline.  Moves bil ext purposefully w good strength. cranial nerve II-XII are grossly intact, no focal motor deficit, sensory intact to light touch bilaterally.  Skin: Warm, dry and intact. No rash, cyanosis, or clubbing.  Psychiatric: Normal mood and affect. speech and behavior is normal. Judgment and thought content normal. Cognition and memory are normal.       Labs on Admission:    Basic Metabolic Panel:  Recent Labs Lab 07/04/14 1428  NA 167*  K 5.1  CL 127*  CO2 21  GLUCOSE 534*  BUN 118*  CREATININE 5.21*  CALCIUM 9.3   Liver Function Tests:  Recent Labs Lab  07/04/14 1428  AST 13  ALT 13  ALKPHOS 91  BILITOT 0.4  PROT 7.0  ALBUMIN 2.8*   No results found for this basename: LIPASE, AMYLASE,  in the last 168 hours No results found for this basename: AMMONIA,  in the last 168 hours CBC:  Recent Labs Lab 07/04/14 1428  WBC 9.7  HGB 11.8*  HCT 38.5*  MCV 97.2  PLT 74*   Cardiac Enzymes: No results found for this basename: CKTOTAL, CKMB, CKMBINDEX, TROPONINI,  in the last 168 hours  BNP (last 3 results) No results found for this basename: PROBNP,  in the last 8760 hours    CBG:  Recent Labs Lab 07/04/14 1628  GLUCAP 412*    Radiological Exams on Admission: Dg Chest [facility]  1 View  07/04/2014   CLINICAL DATA:  Shortness of breath.  EXAM: PORTABLE CHEST - 1 VIEW  COMPARISON:  09/13/2009  FINDINGS: The heart size and mediastinal contours are within normal limits. Both lungs are clear. Degenerative changes in the thoracic spine and at the left shoulder.  IMPRESSION: No acute abnormalities of the chest.   Electronically Signed   By: Geanie Cooley M.D.   On: 07/04/2014 15:32    EKG: Independently reviewed.  Assessment/Plan Active Problems:   Altered mental status   Sepsis   Altered mental status Likely multifactorial in the setting of UTI, hypernatremia, dehydration Patient awake with no gross focal neurologic deficits   UTI We'll start the patient on cefepime, blood culture urine culture has been drawn  Hyperosmolar hyperglycemic state Patient has received IV insulin x2 in the ED We'll start the patient on Lantus and sliding scale insulin Hold by mouth hypoglycemics at this time  Hypertension We'll continue with when necessary hydralazine and continue with oral clonidine and Imdur Initial blood pressure was soft, therefore we'll titrate up these medications slowly Patient has received a total of 3 L of normal saline overnight  Acute renal failure-prerenal Patient on Benzapril Likely the setting of dehydration  secondary to hypernatremia   Dementia Patient is currently on Depakote We'll check levels  Code Status:   full as per advanced directives Family Communication: bedside Disposition Plan: admit   Time spent: 70 mins   [facility] Pager [phone]  If 7PM-7AM, please contact night-coverage [email or web address] Password [facility] 07/04/2014, 4:36 PM

## 2014-07-04 NOTE — ED Notes (Signed)
Family at bedside. 

## 2014-07-04 NOTE — ED Notes (Signed)
Pt has been aggressive and eyes open to voice.  Pt changed and repositioned

## 2014-07-04 NOTE — ED Notes (Signed)
Pt is [facility] and was sent here after having a nonwitnessed fall where he was found in the floor by staff.  Pt is severely demented and mostly nonverbal at baseline.  Sent here because more difficult to arouse.  Eyes open to voice.  CBG 440 per ems.  Smells of urine and stool.  Pt moving arms.

## 2014-07-04 NOTE — ED Notes (Signed)
Pt removes cardiac leads & moves his arms while attempting to check BP, multiple attempts to assess BP

## 2014-07-04 NOTE — Progress Notes (Signed)
Dr. Susie Cassette advised of b/p 81/43-hr 54.. And temp of 92 rectally. Orders for liter bolus of Saline and warm blankets.

## 2014-07-04 NOTE — ED Provider Notes (Signed)
CSN: [id number]     Arrival date & time 07/04/14  1300 History   First MD Initiated Contact with Patient 07/04/14 1324     Chief Complaint  Patient presents with  . Altered Mental Status     (Consider location/radiation/quality/duration/timing/severity/associated sxs/prior Treatment) Patient is a 80 y.o. male presenting with altered mental status. The history is provided by the patient and the EMS personnel. The history is limited by the condition of the patient.  Altered Mental Status Associated symptoms: no fever   pt with hx advanced dementia, generally non verbal at baseline, confused at baseline, with reported fall at e[facility]. No loc.  There was concern noted as to whether pt less alert/more confused than baseline.  No emesis. No fevers. Pt appears content and alert. Pt not verbally responsive - level 5 caveat.     Past Medical History  Diagnosis Date  . 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 Hypertension   . Coronary artery disease 2008     LAD of roughly 40% and 50%; small (<41mm) D2 - 90% ostial; small OM3 - ostial 90%  . Alzheimer disease     Moderate dementia  . Dyslipidemia   . Diabetic peripheral neuropathy associated with type 2 diabetes mellitus   . CKD (chronic kidney disease), stage III   . AAA (abdominal aortic aneurysm) without rupture     Mild infrarenal to play with left common iliac ears and   Past Surgical History  Procedure Laterality Date  . Cardiac catheterization  2008    40-50% LAD lesions, 90% ostial D2 (less than 2 mm) 80% ostial OM 3 (less than 2 mm)  . Nm myoview ltd  February 2010    No ischemia or infarction  . Doppler echocardiography  April 2009    Borderline concentric LVH, otherwise normal.  . Lower extremity artery dopplers      AAA  minimal dilation just proximal to the iliac bifurcation measuring 3.3 x 3.45 cm.  Left common iliac was aneurysmal dilatation measuring 2.57-2.7 cm; ABIs: 017) 1.0 left.   Bilateral arteries with 3 vessel runoff    Family History  Problem Relation Age of Onset  . CAD      Noncontributory   History  Substance Use Topics  . Smoking status: Former Smoker    Quit date: 11/16/1992  . Smokeless tobacco: Not on file  . Alcohol Use: No    Review of Systems  Unable to perform ROS: Dementia  Constitutional: Negative for fever.  level 5 caveat, not verbally responsive (at baseline).     Allergies  Demerol  Home Medications   Prior to Admission medications   Medication Sig Start Date End Date Taking? Authorizing Provider  aspirin 81 MG tablet Take 81 mg by mouth daily.    Historical Provider, MD  benazepril (LOTENSIN) 10 MG tablet Take 10 mg by mouth daily.    Historical Provider, MD  cilostazol (PLETAL) 50 MG tablet TAKE 1 TABLET TWICE DAILY. 07/29/13   Marykay Lex, MD  cloNIDine (CATAPRES) 0.1 MG tablet Take 0.1 mg by mouth 2 (two) times daily.    Historical Provider, MD  furosemide (LASIX) 20 MG tablet Take 2 tablets (40 mg total) by mouth every other day. As needed for venous leg swelling 05/16/13   Marykay Lex, MD  gabapentin (NEURONTIN) 100 MG capsule Take 100 mg by mouth daily.    Historical Provider, MD  glimepiride (AMARYL) 1 MG  tablet Take 1 mg by mouth daily before breakfast.    Historical Provider, MD  isosorbide mononitrate (IMDUR) 120 MG 24 hr tablet Take 120 mg by mouth daily.    Historical Provider, MD  metoprolol succinate (TOPROL XL) 25 MG 24 hr tablet Take 1 tablet (25 mg total) by mouth daily. 05/16/13   Marykay Lex, MD  nitroGLYCERIN (NITROSTAT) 0.4 MG SL tablet Place 1 tablet (0.4 mg total) under the tongue every 5 (five) minutes as needed for chest pain. 05/16/13   Marykay Lex, MD  simvastatin (ZOCOR) 40 MG tablet TAKE 1 TABLET BY MOUTH AT BEDTIME 07/02/13   Marykay Lex, MD  sitaGLIPtan-metformin (JANUMET) 50-500 MG per tablet Take 1 tablet by mouth daily with breakfast.    Historical Provider, MD   BP 101/68  Pulse  66  Resp 16  SpO2 97% Physical Exam  Nursing note and vitals reviewed. Constitutional: He appears well-developed and well-nourished. No distress.  HENT:  Head: Atraumatic.  Nose: Nose normal.  Mouth/Throat: Oropharynx is clear and moist.  No signs of head/scalp trauma. No bruising, no contusion, no tenderness.   Eyes: Conjunctivae and EOM are normal. Pupils are equal, round, and reactive to light. No scleral icterus.  Neck: Neck supple. No tracheal deviation present.  No stiffness or rigidity  Cardiovascular: Normal rate, regular rhythm, normal heart sounds and intact distal pulses.  Exam reveals no gallop and no friction rub.   No murmur heard. Pulmonary/Chest: Effort normal and breath sounds normal. No accessory muscle usage. No respiratory distress.  Abdominal: Soft. Bowel sounds are normal. He exhibits no distension and no mass. There is no tenderness. There is no rebound and no guarding.  Genitourinary:  No cva tenderness  Musculoskeletal: Normal range of motion. He exhibits no edema and no tenderness.  CTLS spine, non tender, aligned, no step off. Good rom bil ext without pain, sts, or focal bony tenderness.   Lymphadenopathy:    He has no cervical adenopathy.  Neurological: He is alert.  Appears alert, content.  Not verbally responsive/at baseline.  Moves bil ext purposefully w good strength.   Skin: Skin is warm and dry. No rash noted. He is not diaphoretic.  Psychiatric:  Alert, content.     ED Course  Procedures (including critical care time) Labs Review  Results for orders placed during the hospital encounter of 07/04/14  CBC      Result Value Ref Range   WBC 9.7  4.0 - 10.5 K/uL   RBC 3.96 (*) 4.22 - 5.81 MIL/uL   Hemoglobin 11.8 (*) 13.0 - 17.0 g/dL   HCT 16.1 (*) 09.6 - 04.5 %   MCV 97.2  78.0 - 100.0 fL   MCH 29.8  26.0 - 34.0 pg   MCHC 30.6  30.0 - 36.0 g/dL   RDW 40.9  81.1 - 91.4 %   Platelets 74 (*) 150 - 400 K/uL  COMPREHENSIVE METABOLIC PANEL       Result Value Ref Range   Sodium 167 (*) 137 - 147 mEq/L   Potassium 5.1  3.7 - 5.3 mEq/L   Chloride 127 (*) 96 - 112 mEq/L   CO2 21  19 - 32 mEq/L   Glucose, Bld 534 (*) 70 - 99 mg/dL   BUN PENDING  6 - 23 mg/dL   Creatinine, Ser 7.82 (*) 0.50 - 1.35 mg/dL   Calcium 9.3  8.4 - 95.6 mg/dL   Total Protein 7.0  6.0 - 8.3 g/dL  Albumin 2.8 (*) 3.5 - 5.2 g/dL   AST 13  0 - 37 U/L   ALT 13  0 - 53 U/L   Alkaline Phosphatase 91  39 - 117 U/L   Total Bilirubin 0.4  0.3 - 1.2 mg/dL   GFR calc non Af Amer 10 (*) >90 mL/min   GFR calc Af Amer 11 (*) >90 mL/min   Anion gap 19 (*) 5 - 15  URINALYSIS, ROUTINE W REFLEX MICROSCOPIC      Result Value Ref Range   Color, Urine YELLOW  YELLOW   APPearance TURBID (*) CLEAR   Specific Gravity, Urine 1.023  1.005 - 1.030   pH 6.0  5.0 - 8.0   Glucose, UA 100 (*) NEGATIVE mg/dL   Hgb urine dipstick MODERATE (*) NEGATIVE   Bilirubin Urine NEGATIVE  NEGATIVE   Ketones, ur NEGATIVE  NEGATIVE mg/dL   Protein, ur >914 (*) NEGATIVE mg/dL   Urobilinogen, UA 0.2  0.0 - 1.0 mg/dL   Nitrite NEGATIVE  NEGATIVE   Leukocytes, UA SMALL (*) NEGATIVE  LACTIC ACID, PLASMA      Result Value Ref Range   Lactic Acid, Venous 4.1 (*) 0.5 - 2.2 mmol/L  URINE MICROSCOPIC-ADD ON      Result Value Ref Range   WBC, UA TOO NUMEROUS TO COUNT  <3 WBC/hpf   RBC / HPF 3-6  <3 RBC/hpf   Bacteria, UA MANY (*) RARE       MDM   Labs.  Reviewed nursing notes and prior charts for additional history.   Iv ns bolus.  bp low. Lactic acid added to labs. Additional ns iv boluses.  ua pos. u cx sent. Rocephin iv.  Recheck bp improved.  Na returns very low.  Med service called to admit.  CRITICAL CARE  RE hypotension, urosepsis, severe hypernatremia, dehydration.  Performed by: Suzi Roots Total critical care time: 40 Critical care time was exclusive of separately billable procedures and treating other patients. Critical care was necessary to treat or prevent  imminent or life-threatening deterioration. Critical care was time spent personally by me on the following activities: development of treatment plan with patient and/or surrogate as well as nursing, discussions with consultants, evaluation of patient's response to treatment, examination of patient, obtaining history from patient or surrogate, ordering and performing treatments and interventions, ordering and review of laboratory studies, ordering and review of radiographic studies, pulse oximetry and re-evaluation of patient's condition.   Additional ns iv.   Glucose v high, hco3 normal.  Insulin sq.     Suzi Roots, MD 07/04/14 [phone]

## 2014-07-05 ENCOUNTER — Inpatient Hospital Stay (HOSPITAL_COMMUNITY): Payer: Medicare (Managed Care)

## 2014-07-05 DIAGNOSIS — N179 Acute kidney failure, unspecified: Secondary | ICD-10-CM

## 2014-07-05 DIAGNOSIS — R579 Shock, unspecified: Secondary | ICD-10-CM

## 2014-07-05 DIAGNOSIS — F0390 Unspecified dementia without behavioral disturbance: Secondary | ICD-10-CM

## 2014-07-05 DIAGNOSIS — E1100 Type 2 diabetes mellitus with hyperosmolarity without nonketotic hyperglycemic-hyperosmolar coma (NKHHC): Secondary | ICD-10-CM

## 2014-07-05 DIAGNOSIS — N184 Chronic kidney disease, stage 4 (severe): Secondary | ICD-10-CM

## 2014-07-05 DIAGNOSIS — G9340 Encephalopathy, unspecified: Secondary | ICD-10-CM

## 2014-07-05 DIAGNOSIS — F0280 Dementia in other diseases classified elsewhere without behavioral disturbance: Secondary | ICD-10-CM

## 2014-07-05 DIAGNOSIS — R4182 Altered mental status, unspecified: Secondary | ICD-10-CM

## 2014-07-05 DIAGNOSIS — G309 Alzheimer's disease, unspecified: Secondary | ICD-10-CM

## 2014-07-05 DIAGNOSIS — E1101 Type 2 diabetes mellitus with hyperosmolarity with coma: Secondary | ICD-10-CM

## 2014-07-05 DIAGNOSIS — A419 Sepsis, unspecified organism: Principal | ICD-10-CM

## 2014-07-05 DIAGNOSIS — E870 Hyperosmolality and hypernatremia: Secondary | ICD-10-CM

## 2014-07-05 LAB — BASIC METABOLIC PANEL
Anion gap: 11 (ref 5–15)
BUN: 94 mg/dL — ABNORMAL HIGH (ref 6–23)
BUN: 88 mg/dL — AB (ref 6–23)
BUN: 84 mg/dL — AB (ref 6–23)
CALCIUM: 8.2 mg/dL — AB (ref 8.4–10.5)
CALCIUM: 8.1 mg/dL — AB (ref 8.4–10.5)
CALCIUM: 8.2 mg/dL — AB (ref 8.4–10.5)
CO2: 17 mEq/L — ABNORMAL LOW (ref 19–32)
CO2: 19 meq/L (ref 19–32)
CO2: 19 meq/L (ref 19–32)
CREATININE: 3.3 mg/dL — AB (ref 0.50–1.35)
Chloride: >130 mEq/L (ref 96–112)
Chloride: >130 mEq/L (ref 96–112)
Chloride: 130 mEq/L — ABNORMAL HIGH (ref 96–112)
Creatinine, Ser: 3.9 mg/dL — ABNORMAL HIGH (ref 0.50–1.35)
Creatinine, Ser: 3.73 mg/dL — ABNORMAL HIGH (ref 0.50–1.35)
GFR calc Af Amer: 16 mL/min — ABNORMAL LOW (ref >90)
GFR calc Af Amer: 17 mL/min — ABNORMAL LOW (ref >90)
GFR calc Af Amer: 19 mL/min — ABNORMAL LOW (ref >90)
GFR, EST NON AFRICAN AMERICAN: 14 mL/min — AB (ref >90)
GFR, EST NON AFRICAN AMERICAN: 14 mL/min — AB (ref >90)
GFR, EST NON AFRICAN AMERICAN: 17 mL/min — AB (ref >90)
GLUCOSE: 192 mg/dL — AB (ref 70–99)
Glucose, Bld: 142 mg/dL — ABNORMAL HIGH (ref 70–99)
Glucose, Bld: 97 mg/dL (ref 70–99)
Potassium: 4.3 mEq/L (ref 3.7–5.3)
Potassium: 4.2 mEq/L (ref 3.7–5.3)
Potassium: 3.9 mEq/L (ref 3.7–5.3)
SODIUM: 167 meq/L — AB (ref 137–147)
SODIUM: 164 meq/L — AB (ref 137–147)
Sodium: 160 mEq/L — ABNORMAL HIGH (ref 137–147)

## 2014-07-05 LAB — CBC
HCT: 37.4 % — ABNORMAL LOW (ref 39.0–52.0)
Hemoglobin: 11.3 g/dL — ABNORMAL LOW (ref 13.0–17.0)
MCH: 29.8 pg (ref 26.0–34.0)
MCHC: 30.2 g/dL (ref 30.0–36.0)
MCV: 98.7 fL (ref 78.0–100.0)
Platelets: 86 10*3/uL — ABNORMAL LOW (ref 150–400)
RBC: 3.79 MIL/uL — ABNORMAL LOW (ref 4.22–5.81)
RDW: 15.4 % (ref 11.5–15.5)
WBC: 10.2 10*3/uL (ref 4.0–10.5)

## 2014-07-05 LAB — HEMOGLOBIN A1C
Hgb A1c MFr Bld: 13.4 % — ABNORMAL HIGH (ref <5.7)
Mean Plasma Glucose: 338 mg/dL — ABNORMAL HIGH (ref <117)

## 2014-07-05 LAB — GLUCOSE, CAPILLARY
GLUCOSE-CAPILLARY: 154 mg/dL — AB (ref 70–99)
GLUCOSE-CAPILLARY: 112 mg/dL — AB (ref 70–99)
GLUCOSE-CAPILLARY: 122 mg/dL — AB (ref 70–99)
GLUCOSE-CAPILLARY: 133 mg/dL — AB (ref 70–99)
GLUCOSE-CAPILLARY: 111 mg/dL — AB (ref 70–99)
Glucose-Capillary: 136 mg/dL — ABNORMAL HIGH (ref 70–99)
Glucose-Capillary: 46 mg/dL — ABNORMAL LOW (ref 70–99)
Glucose-Capillary: 96 mg/dL (ref 70–99)

## 2014-07-05 LAB — COMPREHENSIVE METABOLIC PANEL
ALT: 14 U/L (ref 0–53)
AST: 19 U/L (ref 0–37)
Albumin: 2.5 g/dL — ABNORMAL LOW (ref 3.5–5.2)
Alkaline Phosphatase: 86 U/L (ref 39–117)
BUN: 103 mg/dL — ABNORMAL HIGH (ref 6–23)
CO2: 20 mEq/L (ref 19–32)
Calcium: 8.2 mg/dL — ABNORMAL LOW (ref 8.4–10.5)
Chloride: >130 mEq/L (ref 96–112)
Creatinine, Ser: 4.24 mg/dL — ABNORMAL HIGH (ref 0.50–1.35)
GFR calc Af Amer: 14 mL/min — ABNORMAL LOW (ref >90)
GFR calc non Af Amer: 12 mL/min — ABNORMAL LOW (ref >90)
Glucose, Bld: 126 mg/dL — ABNORMAL HIGH (ref 70–99)
Potassium: 3.7 mEq/L (ref 3.7–5.3)
Sodium: 170 mEq/L (ref 137–147)
Total Bilirubin: 0.3 mg/dL (ref 0.3–1.2)
Total Protein: 6.4 g/dL (ref 6.0–8.3)

## 2014-07-05 LAB — BLOOD GAS, ARTERIAL
Acid-base deficit: 7.1 mmol/L — ABNORMAL HIGH (ref 0.0–2.0)
Bicarbonate: 18 mEq/L — ABNORMAL LOW (ref 20.0–24.0)
DRAWN BY: 31276
O2 Content: 2 L/min
O2 Saturation: 98.7 %
PATIENT TEMPERATURE: 98.6
TCO2: 19.1 mmol/L (ref 0–100)
pCO2 arterial: 36.3 mmHg (ref 35.0–45.0)
pH, Arterial: 7.315 — ABNORMAL LOW (ref 7.350–7.450)
pO2, Arterial: 138 mmHg — ABNORMAL HIGH (ref 80.0–100.0)

## 2014-07-05 LAB — PROCALCITONIN: Procalcitonin: 0.19 ng/mL

## 2014-07-05 LAB — PRO B NATRIURETIC PEPTIDE: PRO B NATRI PEPTIDE: 124.8 pg/mL (ref 0–450)

## 2014-07-05 LAB — PHOSPHORUS: PHOSPHORUS: 4.9 mg/dL — AB (ref 2.3–4.6)

## 2014-07-05 LAB — MAGNESIUM: Magnesium: 2.7 mg/dL — ABNORMAL HIGH (ref 1.5–2.5)

## 2014-07-05 LAB — TROPONIN I: Troponin I: <0.3 ng/mL (ref <0.30)

## 2014-07-05 LAB — PROTIME-INR
INR: 1.21 (ref 0.00–1.49)
Prothrombin Time: 15.3 seconds — ABNORMAL HIGH (ref 11.6–15.2)

## 2014-07-05 LAB — CORTISOL: CORTISOL PLASMA: 17.5 ug/dL

## 2014-07-05 MED ORDER — FREE WATER
200.0000 mL | Freq: Four times a day (QID) | Status: DC
  Administered 2014-07-05 – 2014-07-09 (×16): 200 mL

## 2014-07-05 MED ORDER — CETYLPYRIDINIUM CHLORIDE 0.05 % MT LIQD
7.0000 mL | Freq: Two times a day (BID) | OROMUCOSAL | Status: DC
  Administered 2014-07-05 – 2014-07-09 (×7): 7 mL via OROMUCOSAL

## 2014-07-05 MED ORDER — DEXTROSE 50 % IV SOLN
INTRAVENOUS | Status: AC
  Administered 2014-07-05: 01:00:00
  Filled 2014-07-05: qty 50

## 2014-07-05 MED ORDER — DEXTROSE 5 % IV SOLN
INTRAVENOUS | Status: DC
  Administered 2014-07-05: 150 mL via INTRAVENOUS
  Administered 2014-07-05 – 2014-07-06 (×5): via INTRAVENOUS

## 2014-07-05 MED ORDER — FENTANYL CITRATE 0.05 MG/ML IJ SOLN
INTRAMUSCULAR | Status: AC
  Administered 2014-07-05: 50 ug
  Filled 2014-07-05: qty 2

## 2014-07-05 MED ORDER — HALOPERIDOL LACTATE 5 MG/ML IJ SOLN
INTRAMUSCULAR | Status: AC
  Filled 2014-07-05: qty 1

## 2014-07-05 MED ORDER — HALOPERIDOL LACTATE 5 MG/ML IJ SOLN
2.0000 mg | Freq: Once | INTRAMUSCULAR | Status: AC
  Administered 2014-07-05: 2 mg via INTRAVENOUS

## 2014-07-05 MED ORDER — INSULIN ASPART 100 UNIT/ML ~~LOC~~ SOLN
0.0000 [IU] | SUBCUTANEOUS | Status: DC
  Administered 2014-07-05: 1 [IU] via SUBCUTANEOUS
  Administered 2014-07-06: 5 [IU] via SUBCUTANEOUS
  Administered 2014-07-06 (×3): 3 [IU] via SUBCUTANEOUS
  Administered 2014-07-06 (×2): 5 [IU] via SUBCUTANEOUS
  Administered 2014-07-07: 2 [IU] via SUBCUTANEOUS
  Administered 2014-07-07: 3 [IU] via SUBCUTANEOUS
  Administered 2014-07-07: 2 [IU] via SUBCUTANEOUS
  Administered 2014-07-08: 3 [IU] via SUBCUTANEOUS
  Administered 2014-07-08: 5 [IU] via SUBCUTANEOUS
  Administered 2014-07-08: 8 [IU] via SUBCUTANEOUS
  Administered 2014-07-08: 3 [IU] via SUBCUTANEOUS
  Administered 2014-07-08: 11 [IU] via SUBCUTANEOUS
  Administered 2014-07-09: 2 [IU] via SUBCUTANEOUS
  Administered 2014-07-09: 3 [IU] via SUBCUTANEOUS

## 2014-07-05 MED ORDER — MIDAZOLAM HCL 2 MG/2ML IJ SOLN
INTRAMUSCULAR | Status: AC
  Administered 2014-07-05: 1 mg
  Filled 2014-07-05: qty 2

## 2014-07-05 MED ORDER — CHLORHEXIDINE GLUCONATE 0.12 % MT SOLN
15.0000 mL | Freq: Two times a day (BID) | OROMUCOSAL | Status: AC
  Administered 2014-07-05 – 2014-07-09 (×8): 15 mL via OROMUCOSAL
  Filled 2014-07-05 (×12): qty 15

## 2014-07-05 MED ORDER — SODIUM CHLORIDE 0.45 % IV SOLN
INTRAVENOUS | Status: DC
  Administered 2014-07-05: 01:00:00 via INTRAVENOUS

## 2014-07-05 MED ORDER — DEXTROSE 50 % IV SOLN
1.0000 | Freq: Once | INTRAVENOUS | Status: AC
  Administered 2014-07-05: 50 mL via INTRAVENOUS

## 2014-07-05 MED ORDER — PANTOPRAZOLE SODIUM 40 MG IV SOLR
40.0000 mg | INTRAVENOUS | Status: DC
  Administered 2014-07-05 – 2014-07-06 (×2): 40 mg via INTRAVENOUS
  Filled 2014-07-05 (×4): qty 40

## 2014-07-05 MED ORDER — PHENYLEPHRINE HCL 10 MG/ML IJ SOLN
0.0000 ug/min | INTRAVENOUS | Status: DC
  Administered 2014-07-05: 40 ug/min via INTRAVENOUS
  Administered 2014-07-05: 60 ug/min via INTRAVENOUS
  Administered 2014-07-05: 100 ug/min via INTRAVENOUS
  Filled 2014-07-05 (×3): qty 2

## 2014-07-05 MED ORDER — HYDROCORTISONE NA SUCCINATE PF 100 MG IJ SOLR
50.0000 mg | Freq: Four times a day (QID) | INTRAMUSCULAR | Status: DC
  Administered 2014-07-05 – 2014-07-06 (×6): 50 mg via INTRAVENOUS
  Filled 2014-07-05 (×10): qty 1

## 2014-07-05 NOTE — Consult Note (Addendum)
PULMONARY / CRITICAL CARE MEDICINE   Name: Jake Rollins MRN: [id number] DOB: 08/17/1934    ADMISSION DATE:  07/04/2014 CONSULTATION DATE:  07/05/2014  REFERRING MD :  Susie Cassette of [facility]   CHIEF COMPLAINT:  Circulatory shock in setting of dementia, SNF, full code  INITIAL PRESENTATION:    SIGNIFICANT EVENTS: 07/04/2014 - admit 07/05/2014 - move to ICU     HISTORY OF PRESENT ILLNESS:    80 year old male with a history of advanced Alzheimer's dementia NOS, coronary artery disease status post MI in 2008, hypertension, AAA, chronic kidney disease stage IV, type 2 diabetes with neuropathy nephropathy constant ER for evaluation of altered mental status. The patient is on being more confused compared to his baseline with a reported fall. His CBG was elevated and was found to be greater than 400. Patient is highly confused and does not reveal any history. He was found to be aggressive with the nursing staff. Temperature upon presentation was 96.2. Concern for sepsis because of urinary tract infection, CBG 534, BUN 118, creatinine 5.21 and Na 167; all in setting of ace inhibitor  With above hx admitted to sDU and despite 4-5 L of normal saline has remained hypotensive with MAP 46 and sbp 55-65 with continued obtundation. PCCM asked to transfer patien to ICU on pccm primary service 07/05/14   PAST MEDICAL HISTORY :  Past Medical History  Diagnosis Date  . 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 Hypertension   . Coronary artery disease 2008     LAD of roughly 40% and 50%; small (<92mm) D2 - 90% ostial; small OM3 - ostial 90%  . Alzheimer disease     Moderate dementia  . Dyslipidemia   . Diabetic peripheral neuropathy associated with type 2 diabetes mellitus   . CKD (chronic kidney disease), stage III   . AAA (abdominal aortic aneurysm) without rupture     Mild infrarenal to play with left common iliac ears and   Past Surgical History  Procedure Laterality Date   . Cardiac catheterization  2008    40-50% LAD lesions, 90% ostial D2 (less than 2 mm) 80% ostial OM 3 (less than 2 mm)  . Nm myoview ltd  February 2010    No ischemia or infarction  . Doppler echocardiography  April 2009    Borderline concentric LVH, otherwise normal.  . Lower extremity artery dopplers      AAA  minimal dilation just proximal to the iliac bifurcation measuring 3.3 x 3.45 cm.  Left common iliac was aneurysmal dilatation measuring 2.57-2.7 cm; ABIs: 017) 1.0 left.  Bilateral arteries with 3 vessel runoff    Prior to Admission medications   Medication Sig Start Date End Date Taking? Authorizing Provider  acetaminophen (TYLENOL) 325 MG tablet Take 650 mg by mouth once as needed for mild pain.   Yes Historical Provider, MD  aspirin 81 MG chewable tablet Chew 81 mg by mouth daily.   Yes Historical Provider, MD  benazepril (LOTENSIN) 10 MG tablet Take 10 mg by mouth daily.   Yes Historical Provider, MD  cholecalciferol (VITAMIN D) 1000 UNITS tablet Take 1,000 Units by mouth daily.   Yes Historical Provider, MD  divalproex (DEPAKOTE SPRINKLE) 125 MG capsule Take 500 mg by mouth 2 (two) times daily. Sprinkle on food BID   Yes Historical Provider, MD  donepezil (ARICEPT) 10 MG tablet Take 10 mg by mouth at bedtime.   Yes Historical Provider, MD  furosemide (  LASIX) 40 MG tablet Take 40 mg by mouth every morning.   Yes Historical Provider, MD  gabapentin (NEURONTIN) 100 MG capsule Take 100 mg by mouth daily.   Yes Historical Provider, MD  glimepiride (AMARYL) 2 MG tablet Take 2 mg by mouth daily with breakfast.   Yes Historical Provider, MD  isosorbide mononitrate (IMDUR) 60 MG 24 hr tablet Take 60 mg by mouth every morning.   Yes Historical Provider, MD  metoprolol succinate (TOPROL XL) 25 MG 24 hr tablet Take 1 tablet (25 mg total) by mouth daily. 05/16/13  Yes Marykay Lex, MD  nitroGLYCERIN (NITROSTAT) 0.4 MG SL tablet Place 1 tablet (0.4 mg total) under the tongue every 5 (five)  minutes as needed for chest pain. 05/16/13  Yes Marykay Lex, MD  sitaGLIPtin (JANUVIA) 50 MG tablet Take 50 mg by mouth daily.   Yes Historical Provider, MD   Allergies  Allergen Reactions  . Demerol [Meperidine] Other (See Comments)    Unknown; not listed on MAR    FAMILY HISTORY:  Family History  Problem Relation Age of Onset  . CAD      Noncontributory   SOCIAL HISTORY:  reports that he quit smoking about 21 years ago. He does not have any smokeless tobacco history on file. He reports that he does not drink alcohol or use illicit drugs.    VITAL SIGNS: Temp:  [91.8 F (33.2 C)-96.2 F (35.7 C)] 91.8 F (33.2 C) (09/08 2030) Pulse Rate:  [48-66] 54 (09/09 0123) Resp:  [11-34] 18 (09/09 0123) BP: (57-178)/(33-79) 62/36 mmHg (09/09 0123) SpO2:  [97 %-100 %] 100 % (09/09 0123) HEMODYNAMICS:   VENTILATOR SETTINGS:   INTAKE / OUTPUT:  Intake/Output Summary (Last 24 hours) at 07/05/14 0153 Last data filed at 07/04/14 2119  Gross per 24 hour  Intake     50 ml  Output      0 ml  Net     50 ml    PHYSICAL EXAMINATION: General:  Frail, elderly male, sick looking Neuro:  RASS -2/-3  HEENT:  Supple neck Cardiovascular:  S1S2+. No murmurs Lungs:  Shallow respiration, no crackles, no wheeze Abdomen:  Soft, BS +. No rebound. No guarding Musculoskeletal:  No cyanosis, No clubbing. No edema Skin:  Not sure if there are sacral ulcers  LABS:  PULMONARY No results found for this basename: PHART, PCO2, PCO2ART, PO2, PO2ART, HCO3, TCO2, O2SAT,  in the last 168 hours  CBC  Recent Labs Lab 07/04/14 1428 07/04/14 1925  HGB 11.8* 10.8*  HCT 38.5* 34.8*  WBC 9.7 8.0  PLT 74* 65*    COAGULATION No results found for this basename: INR,  in the last 168 hours  CARDIAC   Recent Labs Lab 07/04/14 1925 07/04/14 2149  TROPONINI <0.30 <0.30   No results found for this basename: PROBNP,  in the last 168 hours   CHEMISTRY  Recent Labs Lab 07/04/14 1428  07/04/14 1925 07/04/14 2149  NA 167*  --  170*  K 5.1  --  4.0  CL 127*  --  >130*  CO2 21  --  21  GLUCOSE 534*  --  94  BUN 118*  --  111*  CREATININE 5.21* 4.93* 4.65*  CALCIUM 9.3  --  8.1*  MG  --  3.2*  --    The CrCl is unknown because both a height and weight (above a minimum accepted value) are required for this calculation.   LIVER  Recent Labs Lab  07/04/14 1428  AST 13  ALT 13  ALKPHOS 91  BILITOT 0.4  PROT 7.0  ALBUMIN 2.8*     INFECTIOUS  Recent Labs Lab 07/04/14 1437  LATICACIDVEN 4.1*     ENDOCRINE CBG (last 3)   Recent Labs  07/04/14 2159 07/05/14 0035 07/05/14 0108  GLUCAP 104* 46* 154*         IMAGING x48h Dg Chest Port 1 View  07/04/2014   CLINICAL DATA:  Shortness of breath.  EXAM: PORTABLE CHEST - 1 VIEW  COMPARISON:  09/13/2009  FINDINGS: The heart size and mediastinal contours are within normal limits. Both lungs are clear. Degenerative changes in the thoracic spine and at the left shoulder.  IMPRESSION: No acute abnormalities of the chest.   Electronically Signed   By: Geanie Cooley M.D.   On: 07/04/2014 15:32      ASSESSMENT / PLAN:  PULMONARY  A:AT risk for intubation and acute respiratory failure due to obtundation and shock P:   Check abg Low threshold to intubate o2 for pulse ox goal > 88%  CARDIOVASCULAR CVL planned 07/05/14  A: Circulatory shock despite 4-5L normal saline P:  Start NEO for map goal > 65 Empiric hydrocort after sending hydrocort  RENAL A:  Severe Hypernatermia with renal failure - worse with normal saline hydration for shok P:   Dc all normal saline Resus with d5 water and free water   GASTROINTESTINAL A:  NPO P:   protonix  HEMATOLOGIC A:  Anemia Thrombocytopenia  P:  - PRBC for hgb </= 6.9gm%    - exceptions are   -  if ACS susepcted/confirmed then transfuse for hgb </= 8.0gm%,  or    -   active bleeding with hemodynamic instability, then transfuse regardless of hemoglobin  value   At at all times try to transfuse 1 unit prbc as possible with exception of active hemorrhage  Monitor platelet count; dc sq lovenox Start SCD    INFECTIOUS A:  Likely UTI P:   BCx2  UC  Sputu Abx: rocephin, cefepime   ENDOCRINE A:  Stress dose steroids. Also  hypothermic P:   monitor bair hugger  NEUROLOGIC A:  supposedl advanced dementia. Currently obtunded P:   RASS goal: 0 to -2 equivalent monitor  GLOBAL 07/05/14: no family at bedside. Full Code     The patient is critically ill with multiple organ systems failure and requires high complexity decision making for assessment and support, frequent evaluation and titration of therapies, application of advanced monitoring technologies and extensive interpretation of multiple databases.   Critical Care Time devoted to patient care services described in this note is  45  Minutes.  Dr. Kalman Shan, M.D., [facility].C.P Pulmonary and Critical Care Medicine Staff Physician [facility] [place] [facility] Pager: [phone], If no answer or between  15:00h - 7:00h: call [phone]  07/05/2014 2:25 AM

## 2014-07-05 NOTE — Progress Notes (Signed)
SLP Cancellation Note  Patient Details Name: Jake Rollins MRN: [id number] DOB: 1934-03-20   Cancelled treatment:       Reason Eval/Treat Not Completed: Fatigue/lethargy limiting ability to participate  Ferdinand Lango MA, CCC-SLP [phone]  [name] 07/05/2014, 10:14 AM

## 2014-07-05 NOTE — Procedures (Signed)
Staff MD  Supervised bedside procedure Real time 2D ultrasound used for vein site selection, patency assessment, and needle entry./ A record of image was made but could not be submitted for filing due to malfunction of printing device  Dr. Kalman Shan, M.D., [facility].C.P Pulmonary and Critical Care Medicine Staff Physician [facility] [place] [facility] Pager: [phone], If no answer or between  15:00h - 7:00h: call [phone]  07/05/2014 10:35 AM

## 2014-07-05 NOTE — Progress Notes (Signed)
Event: Multiple calls this evening from RN regarding persistent hypotension and hypothermia. Bair hugger in place and temp is slowly trending up. Currently 3 F. Pt has received 2 L IVF boluses and is currently receiving his 3rd since arrival to the floor for a total of 5L. Na at 2150 170 from 167 at 1430 today, Cl >130 from 127. NP to bedside Subjective: Pt unable to provide information. Objective: Mr Cosma ia a 80 year old male from SNF w/ h/o advanced Alzheimer's dementia, CAD s/p MI in 2008, HTN, AAA, CKD stage IV, type 2 DM with neuropathy who was sent to ED by staff at SNF to be evaluated for  of altered mental status. Pt reportedly more confused than baseline and was reported to have had a fall. In ED pt's CBG noted to be > 400, pt was very aggressive and hypothermic (96.2). Pt admitted for AMS felt likely multifactorial given UTI (likely sepsis?), hypernatremia and dehydration. At bedside pt noted resting w/ eyes closed in NAD. He will partially open eyes briefly to painful stimuli but otherwise essentially obtunded. CBG currently 46, 1 amp D50 given.  Skin w/d. VS T-92, P-54, R-20 w/ 02 sats of 100% on 2L [place] and current BP is 62/36 (72/40 manuel). A foley has been placed and pt has put out approx 300-400 cc of thick cloudy urine (part of which was incontinent when foley placed). Assessment/Plan: 1. Persistent hypotension (In setting of Sepsis, UTI felt likely source): Refractory to IVF resuscitation of 4-5 L. Current MAP in the 40's. 2. Severe Hypernatremia (In setting of acute on chronic renal failure) :  Worsening s/p NS fluid resuscitation for persistent hypotension.  3. Hypothermia: Improving w/ Bair hugger blanket. Spoke w/ Dr Delton Coombes w/ [facility] who has recommended transfer to ICU. PCCM to manage while in ICU. Appreciate PCCM input.   Leanne Chang, NP-C [facility] Pager [phone]

## 2014-07-05 NOTE — Care Management Note (Signed)
    Page 1 of 1   07/05/2014     2:04:58 PM CARE MANAGEMENT NOTE 07/05/2014  Patient:  Jake Rollins, Jake Rollins   Account Number:  [id number]  Date Initiated:  07/05/2014  Documentation initiated by:  Avie Arenas  Subjective/Objective Assessment:   Admitted with AMS - Sepsis     Action/Plan:   Anticipated DC Date:  07/12/2014   Anticipated DC Plan:  HOME W HOME HEALTH SERVICES      DC Planning Services  CM consult      Choice offered to / List presented to:             Status of service:   Medicare Important Message given?   (If response is "NO", the following Medicare IM given date fields will be blank) Date Medicare IM given:   Medicare IM given by:   Date Additional Medicare IM given:   Additional Medicare IM given by:    Discharge Disposition:    Per UR Regulation:  Reviewed for med. necessity/level of care/duration of stay  If discussed at Long Length of Stay Meetings, dates discussed:    Comments:  ContactChukwudi, Ewen Spouse [phone]                [facility] Daughter [phone]  07-05-14 2pm Avie Arenas, RNBSN [phone] Spoke with two sisters in room.  [place] in [place] [facility].  [facility] and when wife home goes home to her care, however since she is rehab he has been [facility] [place] [facility]  [place].   May need higher level on discharge.  SW consult placed.

## 2014-07-05 NOTE — Procedures (Signed)
Central Venous Catheter Insertion Procedure Note Jake Rollins [id number] 30-Jun-1934  Procedure: Insertion of Central Venous Catheter Indications: Assessment of intravascular volume, Drug and/or fluid administration and Frequent blood sampling  Procedure Details Consent: Unable to obtain consent because of altered level of consciousness. Time Out: Verified patient identification, verified procedure, site/side was marked, verified correct patient position, special equipment/implants available, medications/allergies/relevent history reviewed, required imaging and test results available.  Performed  Maximum sterile technique was used including antiseptics, cap, gloves, gown, hand hygiene, mask and sheet. Skin prep: Chlorhexidine; local anesthetic administered A antimicrobial bonded/coated triple lumen catheter was placed in the left internal jugular vein using the Seldinger technique.  Evaluation Blood flow good Complications: No apparent complications Patient did tolerate procedure well. Chest X-ray ordered to verify placement.  CXR: pending.  Procedure performed under direct ultrasound guidance for real time vessel cannulation.      Jake Guys, PA - C [place] [facility] Pgr: [phone]  or [phone]

## 2014-07-05 NOTE — Progress Notes (Signed)
Na++ 164, Chloride >130. Informed Dr Sung Amabile

## 2014-07-05 NOTE — Progress Notes (Signed)
Hypoglycemic Event  CBG: 46  Treatment: 1 ampule Dextrose 50  Symptoms:  Follow-up CBG: Time: 0106 CBG Result:155  Possible Reasons for Event: NPO  Comments/MD notified: Tama Gander notified    Micheline Markes, Alessandra Bevels  Remember to initiate Hypoglycemia Order Set & complete

## 2014-07-05 NOTE — Progress Notes (Signed)
PULMONARY / CRITICAL CARE MEDICINE   Name: Jake Rollins MRN: [id number] DOB: 1934-07-11    ADMISSION DATE:  07/04/2014 CONSULTATION DATE:  07/05/2014  REFERRING MD :  TRH  INITIAL PRESENTATION:  Hypotension, AMS, hyperglycemia, profound hypernatremia  SIGNIFICANT EVENTS: 07/04/2014 - admit 07/05/2014 - move to ICU. Cognition improving with treatment of hyperglycemia and hypernatremia    SUBJ: RASS -2. No distress  VITAL SIGNS: Temp:  [90.8 F (32.7 C)-99.9 F (37.7 C)] 99.1 F (37.3 C) (09/09 1554) Pulse Rate:  [48-88] 87 (09/09 1800) Resp:  [11-24] 17 (09/09 1800) BP: (57-177)/(33-159) 126/70 mmHg (09/09 1800) SpO2:  [100 %] 100 % (09/09 1800) Weight:  [84 kg (185 lb 3 oz)] 84 kg (185 lb 3 oz) (09/09 0300) HEMODYNAMICS:   VENTILATOR SETTINGS:   INTAKE / OUTPUT:  Intake/Output Summary (Last 24 hours) at 07/05/14 1839 Last data filed at 07/05/14 1800  Gross per 24 hour  Intake 2085.86 ml  Output   1560 ml  Net 525.86 ml    PHYSICAL EXAMINATION: General:  Frail, elderly male, sick looking Neuro:  RASS -1. MAEs HEENT:  Supple neck Cardiovascular:  S1S2+. No murmurs Lungs:  Shallow respiration, no crackles, no wheeze Abdomen:  Soft, BS +. No rebound. No guarding Musculoskeletal:  No cyanosis, No clubbing. No edema  LABS: I have reviewed all of today's lab results. Relevant abnormalities are discussed in the A/P section  CXR: NACPD  ASSESSMENT / PLAN:  PULMONARY  A: No acute issues P:   Monitor O2 as needed  CARDIOVASCULAR L IJ CVL 9/09 >>  A:  Hypovolemic shock - improved P:  Wean PE to off for MAP > 65 mmHg Cont empiric HC for now  RENAL A:   AKI due to volume depletion Profound hypernatremia/dehydration Mild metabolic acidosis due to renal failure P:   Monitor BMET intermittently Monitor I/Os Correct electrolytes as indicated D5W @ 150 cc/hr   GASTROINTESTINAL A:   Dysphagia due to AMS P:   SUP: N/I Consider nutrition  9/10  HEMATOLOGIC A:   Mild anemia, without acute blood loss Thrombocytopenia  P:  DVT px: SCDs heparin Monitor CBC intermittently Transfuse per usual ICU guidelines   INFECTIOUS A:   No overt infection P:   F/U micro data Follow off abx  ENDOCRINE A:   HONK Severe hyperglycemia, resolved P:   Cont SSI  NEUROLOGIC A:   Advanced dementia Severe acute encephalopathy due to hypernatremia P:   RASS goal: 0  Avoid all sedatives  GLOBAL Discussed advanced directives with daughter. More to come    The patient is critically ill with multiple organ systems failure and requires high complexity decision making for assessment and support, frequent evaluation and titration of therapies, application of advanced monitoring technologies and extensive interpretation of multiple databases.   Critical Care Time devoted to patient care services described in this note is  35  Minutes.   Billy Fischer, MD ; [facility] [phone].  After 5:30 PM or weekends, call [phone]

## 2014-07-05 NOTE — Progress Notes (Signed)
CRITICAL VALUE ALERT  Critical value received:  Sodium 170 & Chloride >130  Date of notification:  07/05/2014  Time of notification:  0417  Critical value read back:Yes.    Nurse who received alert:  Crist Fat RN  MD notified (1st page):  Dr. Delton Coombes in elink  Time of first page:  0425  MD notified (2nd page):  Time of second page:  Responding MD:  Delton Coombes  Time MD responded:  [phone]

## 2014-07-06 LAB — PROCALCITONIN: Procalcitonin: 0.12 ng/mL

## 2014-07-06 LAB — CBC
HCT: 29.9 % — ABNORMAL LOW (ref 39.0–52.0)
Hemoglobin: 9.3 g/dL — ABNORMAL LOW (ref 13.0–17.0)
MCH: 29.6 pg (ref 26.0–34.0)
MCHC: 31.1 g/dL (ref 30.0–36.0)
MCV: 95.2 fL (ref 78.0–100.0)
Platelets: 55 10*3/uL — ABNORMAL LOW (ref 150–400)
RBC: 3.14 MIL/uL — ABNORMAL LOW (ref 4.22–5.81)
RDW: 15.2 % (ref 11.5–15.5)
WBC: 8.2 10*3/uL (ref 4.0–10.5)

## 2014-07-06 LAB — BASIC METABOLIC PANEL
Anion gap: 11 (ref 5–15)
BUN: 80 mg/dL — ABNORMAL HIGH (ref 6–23)
CHLORIDE: 126 meq/L — AB (ref 96–112)
CO2: 19 mEq/L (ref 19–32)
Calcium: 7.9 mg/dL — ABNORMAL LOW (ref 8.4–10.5)
Creatinine, Ser: 3.07 mg/dL — ABNORMAL HIGH (ref 0.50–1.35)
GFR calc non Af Amer: 18 mL/min — ABNORMAL LOW (ref >90)
GFR, EST AFRICAN AMERICAN: 21 mL/min — AB (ref >90)
GLUCOSE: 261 mg/dL — AB (ref 70–99)
POTASSIUM: 4 meq/L (ref 3.7–5.3)
Sodium: 156 mEq/L — ABNORMAL HIGH (ref 137–147)

## 2014-07-06 LAB — GLUCOSE, CAPILLARY
Glucose-Capillary: 172 mg/dL — ABNORMAL HIGH (ref 70–99)
Glucose-Capillary: 179 mg/dL — ABNORMAL HIGH (ref 70–99)
Glucose-Capillary: 190 mg/dL — ABNORMAL HIGH (ref 70–99)
Glucose-Capillary: 222 mg/dL — ABNORMAL HIGH (ref 70–99)
Glucose-Capillary: 241 mg/dL — ABNORMAL HIGH (ref 70–99)

## 2014-07-06 LAB — PHOSPHORUS: Phosphorus: 3.9 mg/dL (ref 2.3–4.6)

## 2014-07-06 LAB — MAGNESIUM: MAGNESIUM: 2.3 mg/dL (ref 1.5–2.5)

## 2014-07-06 MED ORDER — JEVITY 1.2 CAL PO LIQD
1000.0000 mL | ORAL | Status: DC
  Administered 2014-07-06: 1000 mL
  Administered 2014-07-08: 13:00:00
  Administered 2014-07-09: 1000 mL
  Filled 2014-07-06 (×10): qty 1000

## 2014-07-06 MED ORDER — SODIUM CHLORIDE 0.9 % IJ SOLN
10.0000 mL | INTRAMUSCULAR | Status: DC | PRN
  Administered 2014-07-06 – 2014-07-07 (×3): 20 mL

## 2014-07-06 MED ORDER — JEVITY 1.2 CAL PO LIQD
1000.0000 mL | ORAL | Status: DC
  Filled 2014-07-06 (×2): qty 1000

## 2014-07-06 MED ORDER — HYDROCORTISONE NA SUCCINATE PF 100 MG IJ SOLR
50.0000 mg | Freq: Two times a day (BID) | INTRAMUSCULAR | Status: DC
  Administered 2014-07-06 – 2014-07-07 (×2): 50 mg via INTRAVENOUS
  Filled 2014-07-06 (×4): qty 1

## 2014-07-06 NOTE — Progress Notes (Addendum)
INITIAL NUTRITION ASSESSMENT  DOCUMENTATION CODES Per approved criteria  -Not Applicable   INTERVENTION:  Initiate TF via NGT with Jevity 1.2 at 20 ml/h, increase by 10 ml every 4 hours to goal rate of 70 ml/h to provide 2016 kcals, 93 gm protein, 1361 ml free water daily.  NUTRITION DIAGNOSIS: Inadequate oral intake related to inability to eat as evidenced by NPO status.   Goal: Intake to meet >90% of estimated nutrition needs.  Monitor:  TF tolerance/adequacy, weight trend, labs, vent status.  Reason for Assessment: MD Consult for TF initiation and management.  80 y.o. male  Admitting Dx: Hypotension, AMS, hyperglycemia, profound hypernatremia   ASSESSMENT: 80 year old male with a history of advanced Alzheimer's dementia, coronary artery disease status post MI in 2008, hypertension, AAA, chronic kidney disease stage IV, type 2 diabetes with neuropathy nephropathy came to the ER on 9/8 for evaluation of altered mental status. His CBG was elevated and was found to be greater than 400.   Nutrition Focused Physical Exam:  Subcutaneous Fat:  Orbital Region: WNL Upper Arm Region: mild depletion Thoracic and Lumbar Region: NA  Muscle:  Temple Region: moderate depletion Clavicle Bone Region: mild depletion Clavicle and Acromion Bone Region: mild depletion Scapular Bone Region: NA Dorsal Hand: NA Patellar Region: mild depletion Anterior Thigh Region: mild depletion Posterior Calf Region: mild depletion  Edema: none  Received MD Consult for TF initiation and management. Patient is not alert enough to take PO's. NGT is in place with tip in the EG junction. Discussed patient in ICU rounds today. Patient transferring out of the ICU today.  Weight stable over the past year. Suspect mild-moderate muscle wasting is related to aging.  Height: Ht Readings from Last 1 Encounters:  07/05/14 6' (1.829 m)    Weight: Wt Readings from Last 1 Encounters:  07/06/14 191 lb 5.8 oz  (86.8 kg)    Ideal Body Weight: 80.9 kg  % Ideal Body Weight: 107%  Wt Readings from Last 10 Encounters:  07/06/14 191 lb 5.8 oz (86.8 kg)  08/02/13 188 lb 6.4 oz (85.458 kg)  05/16/13 201 lb 4.8 oz (91.309 kg)  09/30/12 190 lb (86.183 kg)    Usual Body Weight: 188 lb one year ago  % Usual Body Weight: 102%  BMI:  Body mass index is 25.95 kg/(m^2).  Estimated Nutritional Needs: Kcal: 1900-2100 Protein: 95-110 gm Fluid: >/= 2 L  Skin: WDL  Diet Order: NPO  EDUCATION NEEDS: -Education not appropriate at this time   Intake/Output Summary (Last 24 hours) at 07/06/14 1138 Last data filed at 07/06/14 0900  Gross per 24 hour  Intake 3477.61 ml  Output   2120 ml  Net 1357.61 ml    Last BM: 9/9   Labs:   Recent Labs Lab 07/04/14 1925 07/04/14 2149 07/05/14 0306  07/05/14 1611 07/05/14 2250 07/06/14 0411  NA  --  170* 170*  < > 164* 160* 156*  K  --  4.0 3.7  < > 4.2 3.9 4.0  CL  --  >130* >130*  < > >130* 130* 126*  CO2  --  21 20  < > BUN  --  111* 103*  < > 88* 84* 80*  CREATININE 4.93* 4.65* 4.24*  < > 3.73* 3.30* 3.07*  CALCIUM  --  8.1* 8.2*  < > 8.1* 8.2* 7.9*  MG 3.2*  --  2.7*  --   --   --  2.3  PHOS  --   --  4.9*  --   --   --  3.9  GLUCOSE  --  94 126*  < > 97 192* 261*  < > = values in this interval not displayed.  CBG (last 3)   Recent Labs  07/06/14 0023 07/06/14 0353 07/06/14 0805  GLUCAP 179* 241* 222*    Scheduled Meds: . antiseptic oral rinse  7 mL Mouth Rinse q12n4p  . chlorhexidine  15 mL Mouth Rinse BID  . free water  200 mL Per Tube Q6H  . hydrocortisone sod succinate (SOLU-CORTEF) inj  50 mg Intravenous Q12H  . insulin aspart  0-15 Units Subcutaneous 6 times per day    Continuous Infusions: . dextrose 150 mL/hr at 07/06/14 0900  . feeding supplement (JEVITY 1.2 CAL)      Past Medical History  Diagnosis Date  . 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  Hypertension   . Coronary artery disease 2008     LAD of roughly 40% and 50%; small (<1mm) D2 - 90% ostial; small OM3 - ostial 90%  . Alzheimer disease     Moderate dementia  . Dyslipidemia   . Diabetic peripheral neuropathy associated with type 2 diabetes mellitus   . CKD (chronic kidney disease), stage III   . AAA (abdominal aortic aneurysm) without rupture     Mild infrarenal to play with left common iliac ears and    Past Surgical History  Procedure Laterality Date  . Cardiac catheterization  2008    40-50% LAD lesions, 90% ostial D2 (less than 2 mm) 80% ostial OM 3 (less than 2 mm)  . Nm myoview ltd  February 2010    No ischemia or infarction  . Doppler echocardiography  April 2009    Borderline concentric LVH, otherwise normal.  . Lower extremity artery dopplers      AAA  minimal dilation just proximal to the iliac bifurcation measuring 3.3 x 3.45 cm.  Left common iliac was aneurysmal dilatation measuring 2.57-2.7 cm; ABIs: 017) 1.0 left.  Bilateral arteries with 3 vessel runoff     Joaquin Courts, RD, LDN, CNSC Pager [phone] After Hours Pager [phone]

## 2014-07-06 NOTE — Progress Notes (Signed)
1515 patient arrived on unit from 27M.  Jevity via NG tube in right nare, foley, skin assessed, and bilateral wrist restraints continued per MD order.  Telemetry placed per MD order.  IV team notified of central line.  BP 136/78 HR 69, 100% O2 on ra.

## 2014-07-06 NOTE — Progress Notes (Signed)
Results for NICOLE, HAFLEY (MRN [id number]) as of 07/06/2014 13:44  Ref. Range 07/05/2014 19:26 07/06/2014 00:23 07/06/2014 03:53 07/06/2014 08:05  Glucose-Capillary Latest Range: 70-99 mg/dL 409 (H) 811 (H) 914 (H) 222 (H)  CBGs continue to be greater than 180 mg/dl.  Recommend starting low dose Lantus 10-15 units daily if CBGs continue to be elevated. Will continue to follow while in hospital.  Smith Mince RN BSN CDE

## 2014-07-06 NOTE — Progress Notes (Signed)
PULMONARY / CRITICAL CARE MEDICINE   Name: Jake Rollins MRN: [id number] DOB: [birth year]    ADMISSION DATE:  07/04/2014 CONSULTATION DATE:  07/05/2014  REFERRING MD :  TRH  INITIAL PRESENTATION:  Hypotension, AMS, hyperglycemia, profound hypernatremia  SIGNIFICANT EVENTS: 07/04/2014 - admit 07/05/2014 - move to ICU. Cognition improving with treatment of hyperglycemia and hypernatremia    SUBJ: RASS -1. No distress. Non-conversant. Not F/C  VITAL SIGNS: Temp:  [97.3 F (36.3 C)-98.7 F (37.1 C)] 98 F (36.7 C) (09/10 1100) Pulse Rate:  [51-87] 69 (09/10 1525) Resp:  [8-18] 18 (09/10 1525) BP: (99-151)/(44-106) 136/78 mmHg (09/10 1525) SpO2:  [97 %-100 %] 100 % (09/10 1525) Weight:  [86.8 kg (191 lb 5.8 oz)] 86.8 kg (191 lb 5.8 oz) (09/10 0400) HEMODYNAMICS:   VENTILATOR SETTINGS:   INTAKE / OUTPUT:  Intake/Output Summary (Last 24 hours) at 07/06/14 1654 Last data filed at 07/06/14 1400  Gross per 24 hour  Intake 3333.24 ml  Output   1740 ml  Net 1593.24 ml    PHYSICAL EXAMINATION: General: NAD Neuro:  RASS -1. MAEs HEENT:  WNL Cardiovascular: brady, reg, no M Lungs: Clear Abdomen:  Soft, BS + Ext:  No cyanosis, No clubbing. No edema  LABS: I have reviewed all of today's lab results. Relevant abnormalities are discussed in the A/P section  CXR: NNF  ASSESSMENT / PLAN:   CARDIOVASCULAR L IJ CVL 9/09 >>  A:  Hypovolemic shock - resolved P:  Taper empiric HC for now  RENAL A:   AKI due to volume depletion Profound hypernatremia - improving Mild metabolic acidosis due to renal failure P:   Monitor BMET intermittently Monitor I/Os Correct electrolytes as indicated Decrease D5W to 100 cc/hr  GASTROINTESTINAL A:   Dysphagia due to AMS P:   SUP: N/I Begin TFs 9/10  HEMATOLOGIC A:   Mild anemia, without acute blood loss Thrombocytopenia, unclear etiology P:  DVT px: SCDs Monitor CBC intermittently Transfuse per usual  guidelines  INFECTIOUS A:   No overt infection P:   F/U micro data Follow off abx  ENDOCRINE A:   Severe hyperglycemia/HONK, resolved P:   Cont SSI  NEUROLOGIC A:   Advanced dementia Severe acute encephalopathy due to hypernatremia P:   RASS goal: 0  Avoid all sedatives  GLOBAL Transfer to med-surg. TRH to resume care as of AM 9/11 and PCCM to sign off. Discussed with Dr Joseph Art. I have requested Palliative Care consult to clarify goals of care   Billy Fischer, MD ; [facility] service Mobile [phone].  After 5:30 PM or weekends, call [phone]

## 2014-07-07 ENCOUNTER — Inpatient Hospital Stay (HOSPITAL_COMMUNITY): Payer: Medicare (Managed Care)

## 2014-07-07 LAB — CBC
HEMATOCRIT: 32.4 % — AB (ref 39.0–52.0)
Hemoglobin: 10.5 g/dL — ABNORMAL LOW (ref 13.0–17.0)
MCH: 29.8 pg (ref 26.0–34.0)
MCHC: 32.4 g/dL (ref 30.0–36.0)
MCV: 92 fL (ref 78.0–100.0)
PLATELETS: 79 10*3/uL — AB (ref 150–400)
RBC: 3.52 MIL/uL — ABNORMAL LOW (ref 4.22–5.81)
RDW: 14.4 % (ref 11.5–15.5)
WBC: 9.7 10*3/uL (ref 4.0–10.5)

## 2014-07-07 LAB — BASIC METABOLIC PANEL
ANION GAP: 13 (ref 5–15)
BUN: 66 mg/dL — ABNORMAL HIGH (ref 6–23)
CALCIUM: 8.2 mg/dL — AB (ref 8.4–10.5)
CO2: 18 meq/L — AB (ref 19–32)
CREATININE: 2.21 mg/dL — AB (ref 0.50–1.35)
Chloride: 118 mEq/L — ABNORMAL HIGH (ref 96–112)
GFR calc Af Amer: 31 mL/min — ABNORMAL LOW (ref >90)
GFR, EST NON AFRICAN AMERICAN: 27 mL/min — AB (ref >90)
Glucose, Bld: 144 mg/dL — ABNORMAL HIGH (ref 70–99)
Potassium: 4.3 mEq/L (ref 3.7–5.3)
SODIUM: 149 meq/L — AB (ref 137–147)

## 2014-07-07 LAB — GLUCOSE, CAPILLARY
GLUCOSE-CAPILLARY: 244 mg/dL — AB (ref 70–99)
GLUCOSE-CAPILLARY: 70 mg/dL (ref 70–99)
Glucose-Capillary: 107 mg/dL — ABNORMAL HIGH (ref 70–99)
Glucose-Capillary: 111 mg/dL — ABNORMAL HIGH (ref 70–99)
Glucose-Capillary: 122 mg/dL — ABNORMAL HIGH (ref 70–99)
Glucose-Capillary: 140 mg/dL — ABNORMAL HIGH (ref 70–99)
Glucose-Capillary: 186 mg/dL — ABNORMAL HIGH (ref 70–99)

## 2014-07-07 LAB — URINE CULTURE: Colony Count: >=100000

## 2014-07-07 MED ORDER — IOHEXOL 300 MG/ML  SOLN
50.0000 mL | Freq: Once | INTRAMUSCULAR | Status: AC | PRN
  Administered 2014-07-07: 50 mL via ORAL

## 2014-07-07 MED ORDER — HEPARIN SODIUM (PORCINE) 5000 UNIT/ML IJ SOLN: 5000.0000 [IU] | Freq: Three times a day (TID) | INTRAMUSCULAR | Status: DC

## 2014-07-07 MED ORDER — IOHEXOL 300 MG/ML  SOLN: 50.0000 mL | Freq: Once | INTRAMUSCULAR | Status: DC | PRN

## 2014-07-07 MED ORDER — HYDROCORTISONE NA SUCCINATE PF 100 MG IJ SOLR
25.0000 mg | Freq: Two times a day (BID) | INTRAMUSCULAR | Status: AC
  Administered 2014-07-07: 25 mg via INTRAVENOUS
  Filled 2014-07-07: qty 0.5

## 2014-07-07 MED ORDER — DEXTROSE 5 % IV SOLN
INTRAVENOUS | Status: DC
  Administered 2014-07-07: 09:00:00 via INTRAVENOUS

## 2014-07-07 MED ORDER — DEXTROSE 5 % IV SOLN
1.0000 g | INTRAVENOUS | Status: DC
  Administered 2014-07-07 – 2014-07-10 (×4): 1 g via INTRAVENOUS
  Filled 2014-07-07 (×6): qty 10

## 2014-07-07 NOTE — Progress Notes (Addendum)
Patient Demographics  Jake Rollins, is a 80 y.o. male, DOB - [birth year], ZOX:[id number]  Admit date - 07/04/2014   Admitting Physician Leslye Peer, MD  Outpatient Primary MD for the patient is [name], ERIC, MD  LOS - 3   Chief Complaint  Patient presents with  . Altered Mental Status        Subjective:   [name] today has, No headache, No chest pain, No abdominal pain - No Nausea, No new weakness tingling or numbness, No Cough - SOB. Unreliable historian.  Assessment & Plan     1. HONK with Severe Dehydration - Hypernatremia - AKI- Shock - much improved with IV fluids, currently on D5W which will be continued, monitor CBGs and sliding scale.   2. AK I. Resolved with hydration.   3. DM type II. Stable on sliding scale. May require insulin on discharge.  Lab Results  Component Value Date   HGBA1C 13.4* 07/04/2014    CBG (last 3)   Recent Labs  07/06/14 2359 07/07/14 0433 07/07/14 0803  GLUCAP 140* 111* 107*     4. Advanced dementia, dysphagia. Pulled his NG tube night of 07/06/2014, IR consulted for PEG tube replacement, will resume tube feeds once NG tube is replaced and ready for use.    5. Anemia of chronic disease. Stable.    6. Shock due to hypovolemia plus UTI. Placed on Rocephin, cultures noted, taper steroids as blood pressure stable.      Code Status: DNR  Family Communication: wife and sisters  Disposition Plan: SNF   Procedures IR requested for PEG replacement   Consults  PCCM, IR   Medications  Scheduled Meds: . antiseptic oral rinse  7 mL Mouth Rinse q12n4p  . cefTRIAXone (ROCEPHIN)  IV  1 g Intravenous Q24H  . chlorhexidine  15 mL Mouth Rinse BID  . free water  200 mL Per Tube Q6H  . hydrocortisone sod succinate (SOLU-CORTEF) inj  50 mg  Intravenous Q12H  . insulin aspart  0-15 Units Subcutaneous 6 times per day   Continuous Infusions: . dextrose 125 mL/hr at 07/07/14 0900  . feeding supplement (JEVITY 1.2 CAL) 1,000 mL (07/06/14 2046)   PRN Meds:.acetaminophen, sodium chloride  DVT Prophylaxis  SCD  Lab Results  Component Value Date   PLT 79* 07/07/2014    Antibiotics     Anti-infectives   Start     Dose/Rate Route Frequency Ordered Stop   07/07/14 1000  cefTRIAXone (ROCEPHIN) 1 g in dextrose 5 % 50 mL IVPB     1 g 100 mL/hr over 30 Minutes Intravenous Every 24 hours 07/07/14 0935     07/04/14 1700  ceFEPIme (MAXIPIME) 1 g in dextrose 5 % 50 mL IVPB  Status:  Discontinued     1 g 100 mL/hr over 30 Minutes Intravenous Every 24 hours 07/04/14 1623 07/05/14 1023   07/04/14 1500  cefTRIAXone (ROCEPHIN) 1 g in dextrose 5 % 50 mL IVPB     1 g 100 mL/hr over 30 Minutes Intravenous  Once 07/04/14 1445 07/04/14 1606          Objective:   Filed Vitals:   07/06/14 1200 07/06/14 1525 07/06/14 2017 07/07/14 0444  BP: 107/71 136/78 139/67 153/84  Pulse: 51 69 67 96  Temp:   97.5 F (36.4 C) 97.8 F (36.6 C)  TempSrc:  Oral Axillary Axillary  Resp: Height:      Weight:   88.5 kg (195 lb 1.7 oz)   SpO2: 100% 100% 100% 100%    Wt Readings from Last 3 Encounters:  07/06/14 88.5 kg (195 lb 1.7 oz)  08/02/13 85.458 kg (188 lb 6.4 oz)  05/16/13 91.309 kg (201 lb 4.8 oz)     Intake/Output Summary (Last 24 hours) at 07/07/14 1205 Last data filed at 07/06/14 1900  Gross per 24 hour  Intake 882.75 ml  Output    750 ml  Net 132.75 ml     Physical Exam  Awake but confused, No new F.N deficits, Normal affect [name].AT,PERRAL Supple Neck,No JVD, No cervical lymphadenopathy appriciated.  Symmetrical Chest wall movement, Good air movement bilaterally, CTAB RRR,No Gallops,Rubs or new Murmurs, No Parasternal Heave +ve B.Sounds, Abd Soft, No tenderness, No organomegaly appriciated, No rebound -  guarding or rigidity. No Cyanosis, Clubbing or edema, No new Rash or bruise    Data Review   Micro Results Recent Results (from the past 240 hour(s))  URINE CULTURE     Status: None   Collection Time    07/04/14  1:56 PM      Result Value Ref Range Status   Specimen Description URINE, CATHETERIZED   Final   Special Requests ADDED [id number]   Final   Culture  Setup Time     Final   Value: 07/05/2014 04:46     Performed at [facility]   Colony Count     Final   Value: >=100,000 COLONIES/ML     Performed at [facility]   Culture     Final   Value: PROTEUS MIRABILIS     Performed at [facility]   Report Status 07/07/2014 FINAL   Final   Organism ID, Bacteria PROTEUS MIRABILIS   Final  MRSA PCR SCREENING     Status: None   Collection Time    07/04/14  6:09 PM      Result Value Ref Range Status   MRSA by PCR NEGATIVE  NEGATIVE Final   Comment:            The GeneXpert MRSA Assay (FDA     approved for NASAL specimens     only), is one component of a     comprehensive MRSA colonization     surveillance program. It is not     intended to diagnose MRSA     infection nor to guide or     monitor treatment for     MRSA infections.    Radiology Reports No results found.  CBC  Recent Labs Lab 07/04/14 1428 07/04/14 1925 07/05/14 0306 07/06/14 0411 07/07/14 0605  WBC 9.7 8.0 10.2 8.2 9.7  HGB 11.8* 10.8* 11.3* 9.3* 10.5*  HCT 38.5* 34.8* 37.4* 29.9* 32.4*  PLT 74* 65* 86* 55* 79*  MCV 97.2 97.5 98.7 95.2 92.0  MCH 29.8 30.3 29.8 29.6 29.8  MCHC 30.6 31.0 30.2 31.1 32.4  RDW 15.3 15.2 15.4 15.2 14.4    Chemistries   Recent Labs Lab 07/04/14 1428 07/04/14 1925  07/05/14 0306 07/05/14 1120 07/05/14 1611 07/05/14 2250 07/06/14 0411 07/07/14 0605  NA 167*  --   < > 170* 167* 164* 160* 156* 149*  K 5.1  --   < > 3.7 4.3  4.2 3.9 4.0 4.3  CL 127*  --   < > >130* >130* >130* 130* 126* 118*  CO2 21  --   < > 20 17* 18*    GLUCOSE 534*  --   < > 126* 142* 97 192* 261* 144*  BUN 118*  --   < > 103* 94* 88* 84* 80* 66*  CREATININE 5.21* 4.93*  < > 4.24* 3.90* 3.73* 3.30* 3.07* 2.21*  CALCIUM 9.3  --   < > 8.2* 8.2* 8.1* 8.2* 7.9* 8.2*  MG  --  3.2*  --  2.7*  --   --   --  2.3  --   AST 13  --   --  19  --   --   --   --   --   ALT 13  --   --  14  --   --   --   --   --   ALKPHOS 91  --   --  86  --   --   --   --   --   BILITOT 0.4  --   --  0.3  --   --   --   --   --   < > = values in this interval not displayed. ------------------------------------------------------------------------------------------------------------------ estimated creatinine clearance is 30.7 ml/min (by C-G formula based on Cr of 2.21). ------------------------------------------------------------------------------------------------------------------  Recent Labs  07/04/14 1925  HGBA1C 13.4*   ------------------------------------------------------------------------------------------------------------------ No results found for this basename: CHOL, HDL, LDLCALC, TRIG, CHOLHDL, LDLDIRECT,  in the last 72 hours ------------------------------------------------------------------------------------------------------------------  Recent Labs  07/04/14 1925  TSH 0.701   ------------------------------------------------------------------------------------------------------------------ No results found for this basename: VITAMINB12, FOLATE, FERRITIN, TIBC, IRON, RETICCTPCT,  in the last 72 hours  Coagulation profile  Recent Labs Lab 07/05/14 0306  INR 1.21    No results found for this basename: DDIMER,  in the last 72 hours  Cardiac Enzymes  Recent Labs Lab 07/04/14 1925 07/04/14 2149 07/05/14 0306  TROPONINI <0.30 <0.30 <0.30   ------------------------------------------------------------------------------------------------------------------ No components found with this basename: POCBNP,      Time Spent in minutes  35   Susa Raring K M.D on 07/07/2014 at 12:05 PM  Between 7am to 7pm - Pager - [phone]  After 7pm go to [email or web address] - password [email or web address]  And look for the night coverage person covering for me after hours  [facility] Office  [phone]   **Disclaimer: This note may have been dictated with voice recognition software. Similar sounding words can inadvertently be transcribed and this note may contain transcription errors which may not have been corrected upon publication of note.**

## 2014-07-07 NOTE — Progress Notes (Signed)
Pt gone down via bed for guided NG tube insertion

## 2014-07-07 NOTE — Progress Notes (Signed)
Dr. Thedore Mins spoke with family, stated will be making pt DNR and focus on comfort.  Pt to have G-tube placed with abd binder.

## 2014-07-07 NOTE — Progress Notes (Signed)
Pt's wife Doug Bucklin called and she is pts POA.  Her cell # [phone] and home # [phone].  She is in rehab today and will be going home.  If needed please call either of these numbers.

## 2014-07-07 NOTE — Progress Notes (Signed)
eLink Physician-Brief Progress Note Patient Name: Jake Rollins DOB: Jun 04, 1934 MRN: [id number]   Date of Service  07/07/2014  HPI/Events of Note  Jake Rollins pulled out NG tube  eICU Interventions  Replace tube Order for mitts placed     Intervention Category Minor Interventions: Routine modifications to care plan (e.g. PRN medications for pain, fever)  Jake Rollins 07/07/2014, 2:20 AM

## 2014-07-07 NOTE — Progress Notes (Signed)
Paged Dr. Thedore Mins CBG 70 D5W @ 125 ml/hr, FYI

## 2014-07-07 NOTE — Progress Notes (Signed)
Paged Dr. Thedore Mins with pts wife and POA cell and home numbers.

## 2014-07-07 NOTE — Progress Notes (Signed)
Guided NG tube placed and Jevity 1.2 resumed at previous rate of 45 ml/hr.

## 2014-07-08 LAB — BASIC METABOLIC PANEL
ANION GAP: 11 (ref 5–15)
BUN: 56 mg/dL — ABNORMAL HIGH (ref 6–23)
CALCIUM: 8.4 mg/dL (ref 8.4–10.5)
CO2: 20 mEq/L (ref 19–32)
Chloride: 113 mEq/L — ABNORMAL HIGH (ref 96–112)
Creatinine, Ser: 2.04 mg/dL — ABNORMAL HIGH (ref 0.50–1.35)
GFR calc non Af Amer: 30 mL/min — ABNORMAL LOW (ref >90)
GFR, EST AFRICAN AMERICAN: 34 mL/min — AB (ref >90)
Glucose, Bld: 354 mg/dL — ABNORMAL HIGH (ref 70–99)
Potassium: 4.5 mEq/L (ref 3.7–5.3)
Sodium: 144 mEq/L (ref 137–147)

## 2014-07-08 LAB — GLUCOSE, CAPILLARY
GLUCOSE-CAPILLARY: 143 mg/dL — AB (ref 70–99)
GLUCOSE-CAPILLARY: 158 mg/dL — AB (ref 70–99)
GLUCOSE-CAPILLARY: 181 mg/dL — AB (ref 70–99)
Glucose-Capillary: 158 mg/dL — ABNORMAL HIGH (ref 70–99)
Glucose-Capillary: 204 mg/dL — ABNORMAL HIGH (ref 70–99)
Glucose-Capillary: 255 mg/dL — ABNORMAL HIGH (ref 70–99)
Glucose-Capillary: 321 mg/dL — ABNORMAL HIGH (ref 70–99)

## 2014-07-08 LAB — MAGNESIUM: Magnesium: 2.3 mg/dL (ref 1.5–2.5)

## 2014-07-08 MED ORDER — ISOSORBIDE DINITRATE 30 MG PO TABS
30.0000 mg | ORAL_TABLET | Freq: Two times a day (BID) | ORAL | Status: DC
  Administered 2014-07-09 – 2014-07-10 (×3): 30 mg
  Filled 2014-07-08 (×7): qty 1

## 2014-07-08 MED ORDER — DIVALPROEX SODIUM 125 MG PO CPSP
500.0000 mg | ORAL_CAPSULE | Freq: Two times a day (BID) | ORAL | Status: DC
  Administered 2014-07-08 – 2014-07-10 (×3): 500 mg via ORAL
  Filled 2014-07-08 (×8): qty 4

## 2014-07-08 MED ORDER — ISOSORBIDE MONONITRATE ER 60 MG PO TB24
60.0000 mg | ORAL_TABLET | Freq: Every day | ORAL | Status: DC
  Filled 2014-07-08 (×2): qty 1

## 2014-07-08 MED ORDER — ASPIRIN 81 MG PO CHEW
81.0000 mg | CHEWABLE_TABLET | Freq: Every day | ORAL | Status: DC
  Administered 2014-07-08 – 2014-07-10 (×3): 81 mg via ORAL
  Filled 2014-07-08 (×4): qty 1

## 2014-07-08 MED ORDER — INSULIN GLARGINE 100 UNIT/ML ~~LOC~~ SOLN
15.0000 [IU] | Freq: Every day | SUBCUTANEOUS | Status: DC
  Administered 2014-07-08 – 2014-07-10 (×3): 15 [IU] via SUBCUTANEOUS
  Filled 2014-07-08 (×3): qty 0.15

## 2014-07-08 NOTE — Progress Notes (Signed)
Patient Demographics  Jake Rollins, is a 80 y.o. male, DOB - Jun 25, 1934, ZOX:[id number]  Admit date - 07/04/2014   Admitting Physician Jake Peer, MD  Outpatient Primary MD for the patient is Jake Rollins, ERIC, MD  LOS - 4   Chief Complaint  Patient presents with  . Altered Mental Status        Subjective:   Jake Rollins today has, No headache, No chest pain, No abdominal pain - No Nausea, No new weakness tingling or numbness, No Cough - SOB. Unreliable historian.  Assessment & Plan     1. HONK with Severe Dehydration - Hypernatremia - AKI- Shock - much improved with IV fluids, now on tube feeds via NG along with sliding scale. Stop IV fluids..    2. AK I. Resolved with hydration.    3. DM type II. Stable on sliding scale evident Lantus for better control. May require insulin on discharge.  Lab Results  Component Value Date   HGBA1C 13.4* 07/04/2014    CBG (last 3)   Recent Labs  07/07/14 2330 07/08/14 0418 07/08/14 0755  GLUCAP 143* 321* 255*     4. Advanced dementia, dysphagia. Pulled his NG tube night of 07/06/2014, IR placed NG on 07/07/2014, requiring wrist restraints to keep NG in, do feeds and free water flushes via NG. Have requested speech to evaluate the patient as soon as possible to see if NG and restraints can be discontinued.    5. Anemia of chronic disease. Stable.    6. Shock due to hypovolemia plus UTI. Placed on Rocephin, cultures noted, taper steroids as blood pressure stable.      Code Status: DNR  Family Communication: wife and sisters  Disposition Plan: SNF   Procedures IR requested for PEG replacement   Consults  PCCM, IR   Medications  Scheduled Meds: . antiseptic oral rinse  7 mL Mouth Rinse q12n4p  . cefTRIAXone (ROCEPHIN)  IV  1 g  Intravenous Q24H  . chlorhexidine  15 mL Mouth Rinse BID  . free water  200 mL Per Tube Q6H  . insulin aspart  0-15 Units Subcutaneous 6 times per day  . insulin glargine  15 Units Subcutaneous Daily   Continuous Infusions: . dextrose 20 mL/hr at 07/08/14 0826  . feeding supplement (JEVITY 1.2 CAL) 1,000 mL (07/08/14 0826)   PRN Meds:.acetaminophen, sodium chloride  DVT Prophylaxis  SCD  Lab Results  Component Value Date   PLT 79* 07/07/2014    Antibiotics     Anti-infectives   Start     Dose/Rate Route Frequency Ordered Stop   07/07/14 1000  cefTRIAXone (ROCEPHIN) 1 g in dextrose 5 % 50 mL IVPB     1 g 100 mL/hr over 30 Minutes Intravenous Every 24 hours 07/07/14 0935     07/04/14 1700  ceFEPIme (MAXIPIME) 1 g in dextrose 5 % 50 mL IVPB  Status:  Discontinued     1 g 100 mL/hr over 30 Minutes Intravenous Every 24 hours 07/04/14 1623 07/05/14 1023   07/04/14 1500  cefTRIAXone (ROCEPHIN) 1 g in dextrose 5 % 50 mL IVPB     1 g 100 mL/hr over 30 Minutes Intravenous  Once 07/04/14 1445 07/04/14 1606  Objective:   Filed Vitals:   07/07/14 0444 07/07/14 1739 07/07/14 2136 07/08/14 0544  BP: 153/84 146/102 142/98 155/99  Pulse: 96 102 77 89  Temp: 97.8 F (36.6 C) 97.4 F (36.3 C) 98.5 F (36.9 C) 98.1 F (36.7 C)  TempSrc: Axillary Axillary Axillary Oral  Resp: Height:      Weight:      SpO2: 100% 99% 100% 100%    Wt Readings from Last 3 Encounters:  07/06/14 88.5 kg (195 lb 1.7 oz)  08/02/13 85.458 kg (188 lb 6.4 oz)  05/16/13 91.309 kg (201 lb 4.8 oz)     Intake/Output Summary (Last 24 hours) at 07/08/14 1015 Last data filed at 07/08/14 0826  Gross per 24 hour  Intake 1507.33 ml  Output   2101 ml  Net -593.67 ml     Physical Exam  Awake but confused, No new F.N deficits, Normal affect [place].AT,PERRAL Supple Neck,No JVD, No cervical lymphadenopathy appriciated.  Symmetrical Chest wall movement, Good air movement bilaterally,  CTAB RRR,No Gallops,Rubs or new Murmurs, No Parasternal Heave +ve B.Sounds, Abd Soft, No tenderness, No organomegaly appriciated, No rebound - guarding or rigidity. No Cyanosis, Clubbing , trace of edema and third spacing in all 4 extremities upper more than lower  , No new Rash or bruise    Data Review   Micro Results Recent Results (from the past 240 hour(s))  URINE CULTURE     Status: None   Collection Time    07/04/14  1:56 PM      Result Value Ref Range Status   Specimen Description URINE, CATHETERIZED   Final   Special Requests ADDED [id number]   Final   Culture  Setup Time     Final   Value: 07/05/2014 04:46     Performed at [facility]   Colony Count     Final   Value: >=100,000 COLONIES/ML     Performed at [facility]   Culture     Final   Value: PROTEUS MIRABILIS     Performed at [facility]   Report Status 07/07/2014 FINAL   Final   Organism ID, Bacteria PROTEUS MIRABILIS   Final  MRSA PCR SCREENING     Status: None   Collection Time    07/04/14  6:09 PM      Result Value Ref Range Status   MRSA by PCR NEGATIVE  NEGATIVE Final   Comment:            The GeneXpert MRSA Assay (FDA     approved for NASAL specimens     only), is one component of a     comprehensive MRSA colonization     surveillance program. It is not     intended to diagnose MRSA     infection nor to guide or     monitor treatment for     MRSA infections.    Radiology Reports Dg Basil Dess Tube Plc W/fl-no Rad  07/07/2014   CLINICAL DATA: replace naso gastric tube per Dr Jefm Petty fluoro  guidance   NASO G TUBE PLACEMENT WITH FLUORO  Fluoroscopy was utilized by the requesting physician.  No radiographic  interpretation.     CBC  Recent Labs Lab 07/04/14 1428 07/04/14 1925 07/05/14 0306 07/06/14 0411 07/07/14 0605  WBC 9.7 8.0 10.2 8.2 9.7  HGB 11.8* 10.8* 11.3* 9.3* 10.5*  HCT 38.5* 34.8* 37.4* 29.9* 32.4*  PLT 74* 65* 86* 55*  79*  MCV 97.2 97.5 98.7 95.2  92.0  MCH 29.8 30.3 29.8 29.6 29.8  MCHC 30.6 31.0 30.2 31.1 32.4  RDW 15.3 15.2 15.4 15.2 14.4    Chemistries   Recent Labs Lab 07/04/14 1428 07/04/14 1925  07/05/14 0306  07/05/14 1611 07/05/14 2250 07/06/14 0411 07/07/14 0605 07/08/14 0435  NA 167*  --   < > 170*  < > 164* 160* 156* 149* 144  K 5.1  --   < > 3.7  < > 4.2 3.9 4.0 4.3 4.5  CL 127*  --   < > >130*  < > >130* 130* 126* 118* 113*  CO2 21  --   < > 20  < > 18* 20  GLUCOSE 534*  --   < > 126*  < > 97 192* 261* 144* 354*  BUN 118*  --   < > 103*  < > 88* 84* 80* 66* 56*  CREATININE 5.21* 4.93*  < > 4.24*  < > 3.73* 3.30* 3.07* 2.21* 2.04*  CALCIUM 9.3  --   < > 8.2*  < > 8.1* 8.2* 7.9* 8.2* 8.4  MG  --  3.2*  --  2.7*  --   --   --  2.3  --  2.3  AST 13  --   --  19  --   --   --   --   --   --   ALT 13  --   --  14  --   --   --   --   --   --   ALKPHOS 91  --   --  86  --   --   --   --   --   --   BILITOT 0.4  --   --  0.3  --   --   --   --   --   --   < > = values in this interval not displayed. ------------------------------------------------------------------------------------------------------------------ estimated creatinine clearance is 33.3 ml/min (by C-G formula based on Cr of 2.04). ------------------------------------------------------------------------------------------------------------------ No results found for this basename: HGBA1C,  in the last 72 hours ------------------------------------------------------------------------------------------------------------------ No results found for this basename: CHOL, HDL, LDLCALC, TRIG, CHOLHDL, LDLDIRECT,  in the last 72 hours ------------------------------------------------------------------------------------------------------------------ No results found for this basename: TSH, T4TOTAL, FREET3, T3FREE, THYROIDAB,  in the last 72  hours ------------------------------------------------------------------------------------------------------------------ No results found for this basename: VITAMINB12, FOLATE, FERRITIN, TIBC, IRON, RETICCTPCT,  in the last 72 hours  Coagulation profile  Recent Labs Lab 07/05/14 0306  INR 1.21    No results found for this basename: DDIMER,  in the last 72 hours  Cardiac Enzymes  Recent Labs Lab 07/04/14 1925 07/04/14 2149 07/05/14 0306  TROPONINI <0.30 <0.30 <0.30   ------------------------------------------------------------------------------------------------------------------ No components found with this basename: POCBNP,      Time Spent in minutes 35   Susa Raring K M.D on 07/08/2014 at 10:15 AM  Between 7am to 7pm - Pager - [phone]  After 7pm go to [email or web address] - password [email or web address]  And look for the night coverage person covering for me after hours  [facility] Office  [phone]   **Disclaimer: This note may have been dictated with voice recognition software. Similar sounding words can inadvertently be transcribed and this note may contain transcription errors which may not have been corrected upon publication of note.**

## 2014-07-08 NOTE — Progress Notes (Signed)
07/08/2014 8:53 AM  During shift assessment, restraint check was performed.  Skin area surrounding wrist restraints reddened bilaterally but blancheable.  Serous filled blister, approximately 1*1, noted to right posterior forearm beneath restraint.  BUE edema noted as well, which per the family who was at the bedside, was not noted as present yesterday.  Non restrictive mittens deemed unnecessary at this time, so removed. Restraints loosened and reapplied, as he is still pulling at telemetry lines and attempting to reach for NG tube.  Dr. Candiss Norse notified.  Family at bedside made aware.  To remain in restraints until release criteria met.   Princella Pellegrini

## 2014-07-08 NOTE — Evaluation (Signed)
Clinical/Bedside Swallow Evaluation Patient Details  Name: Jake Rollins MRN: [id number] Date of Birth: 25-May-1934  Today's Date: 07/08/2014 Time: 1050-1120 SLP Time Calculation (min): 30 min  Past Medical History:  Past Medical History  Diagnosis Date  . 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 Hypertension   . Coronary artery disease 2008     LAD of roughly 40% and 50%; small (<71mm) D2 - 90% ostial; small OM3 - ostial 90%  . Alzheimer disease     Moderate dementia  . Dyslipidemia   . Diabetic peripheral neuropathy associated with type 2 diabetes mellitus   . CKD (chronic kidney disease), stage III   . AAA (abdominal aortic aneurysm) without rupture     Mild infrarenal to play with left common iliac ears and   Past Surgical History:  Past Surgical History  Procedure Laterality Date  . Cardiac catheterization  2008    40-50% LAD lesions, 90% ostial D2 (less than 2 mm) 80% ostial OM 3 (less than 2 mm)  . Nm myoview ltd  February 2010    No ischemia or infarction  . Doppler echocardiography  April 2009    Borderline concentric LVH, otherwise normal.  . Lower extremity artery dopplers      AAA  minimal dilation just proximal to the iliac bifurcation measuring 3.3 x 3.45 cm.  Left common iliac was aneurysmal dilatation measuring 2.57-2.7 cm; ABIs: 017) 1.0 left.  Bilateral arteries with 3 vessel runoff    HPI:  80 year old male with a history of advanced Alzheimer's dementia, coronary artery disease status post MI in 2008, hypertension, AAA, chronic kidney disease stage IV, type 2 diabetes with neuropathy nephropathy constant ER for evaluation of altered mental status. Pt found to have profound hypernatremia, hypovolemic shock, dysphagia due to AMS and has been on tube feeding. Hopeful for NG and restraints to be d/c'd.    Assessment / Plan / Recommendation Clinical Impression  Pt presents with a mild cognitive based dyspahgia with initially  decreased awarenss of POs. With max tactile cueing and context pt becomes imcreasingly more aware of PO and more automatic with intake. Oral phase is slightly prolonged with holding possible if pt is less alert or aware. Otherwise, no evidence of aspiration. Pt is able to initiate a dys 3 (mechanical soft) diet with thin liquids and max cues for self feeding. Would recommend NG tube remain in place until pt demonstrates adequate tolerance and intake with PO diet.     Aspiration Risk  Mild    Diet Recommendation Dysphagia 3 (Mechanical Soft);Thin liquid   Liquid Administration via: Cup;Straw Medication Administration: Whole meds with puree Supervision: Staff to assist with self feeding Compensations: Slow rate;Small sips/bites Postural Changes and/or Swallow Maneuvers: Seated upright 90 degrees    Other  Recommendations Oral Care Recommendations: Oral care BID   Follow Up Recommendations  Skilled Nursing facility    Frequency and Duration min 2x/week  2 weeks   Pertinent Vitals/Pain NA    SLP Swallow Goals     Swallow Study Prior Functional Status       General HPI: 80 year old male with a history of advanced Alzheimer's dementia, coronary artery disease status post MI in 2008, hypertension, AAA, chronic kidney disease stage IV, type 2 diabetes with neuropathy nephropathy constant ER for evaluation of altered mental status. Pt found to have profound hypernatremia, hypovolemic shock, dysphagia due to AMS and has been on tube feeding. Hopeful for NG and restraints  to be d/c'd.  Type of Study: Bedside swallow evaluation Diet Prior to this Study: NPO;Panda Temperature Spikes Noted: No Respiratory Status: Room air History of Recent Intubation: No Behavior/Cognition: Alert;Requires cueing;Doesn't follow directions Oral Cavity - Dentition: Adequate natural dentition Self-Feeding Abilities: Needs assist Patient Positioning: Upright in bed Baseline Vocal Quality: Other (comment) (did  not attempt to phonate) Volitional Cough: Cognitively unable to elicit Volitional Swallow: Unable to elicit    Oral/Motor/Sensory Function Overall Oral Motor/Sensory Function: Appears within functional limits for tasks assessed   Ice Chips Ice chips: Impaired Presentation: Spoon Oral Phase Impairments: Poor awareness of bolus   Thin Liquid Thin Liquid: Within functional limits Presentation: Cup;Straw;Self Fed    Nectar Thick Nectar Thick Liquid: Not tested   Honey Thick Honey Thick Liquid: Not tested   Puree Puree: Within functional limits   Solid   GO    Solid: Impaired Oral Phase Impairments: Poor awareness of bolus      Harlon Ditty, MA CCC-SLP [phone]  Claudine Mouton 07/08/2014,12:43 PM

## 2014-07-08 NOTE — Evaluation (Signed)
Physical Therapy Evaluation Patient Details Name: Jake Rollins MRN: [id number] DOB: [birth year] Today's Date: 07/08/2014   History of Present Illness  80 year old male with a history of advanced Alzheimer's dementia, coronary artery disease status post MI in 2008, hypertension, AAA, chronic kidney disease stage IV, type 2 diabetes with neuropathy nephropathy constant ER for evaluation of altered mental status. Pt found to have profound hypernatremia, hypovolemic shock  Clinical Impression  On eval pt very lethargic and difficult to arouse. When aroused briefly unable to follow any commands. Unsure of pt's baseline mobility status. Pt will have to show much more ability to participate in therapy to be appropriate for PT. Will try again in a few days. If remains at this level may be more appropriate for palliative services.    Follow Up Recommendations Other (comment) (Today pt unable to participate in PT. Will reassess next session and see if he is appropriate to continue PT.)    Equipment Recommendations  Other (comment) (to be determined)    Recommendations for Other Services       Precautions / Restrictions Precautions Precautions: Fall      Mobility  Bed Mobility               General bed mobility comments: Attempted to bring pt into sitting at EOB. Unable to get pt into sitting with 1 person total assist.  Transfers                    Ambulation/Gait                Stairs            Wheelchair Mobility    Modified Rankin (Stroke Patients Only)       Balance                                             Pertinent Vitals/Pain Pain Assessment: Faces Faces Pain Scale: No hurt    Home Living Family/patient expects to be discharged to:: Private residence                 Additional Comments: Pt unable to communicate and family not present.    Prior Function           Comments: Pt unable to communicate and  family not present     Hand Dominance        Extremity/Trunk Assessment   Upper Extremity Assessment: Difficult to assess due to impaired cognition (UE's in flexed position and unable to extend them passively.)           Lower Extremity Assessment: Difficult to assess due to impaired cognition         Communication   Communication: Other (comment) (lethargic)  Cognition Arousal/Alertness: Lethargic   Overall Cognitive Status: Difficult to assess                      General Comments      Exercises        Assessment/Plan    PT Assessment Patient needs continued PT services (Will attempt further moblity and assess pt's ability to participate.)  PT Diagnosis Altered mental status   PT Problem List Decreased range of motion;Decreased strength;Decreased activity tolerance;Decreased mobility;Decreased cognition  PT Treatment Interventions Functional mobility training;Therapeutic activities;Balance training;Patient/family education   PT Goals (Current goals can be found in  the Care Plan section) Acute Rehab PT Goals Patient Stated Goal: Pt unable PT Goal Formulation: Patient unable to participate in goal setting Time For Goal Achievement: 07/15/14 Potential to Achieve Goals: Poor    Frequency     Barriers to discharge        Co-evaluation               End of Session   Activity Tolerance: Patient limited by lethargy Patient left: in bed;with call Inge/phone within reach;with bed alarm set           Time: 1527-1540 PT Time Calculation (min): 13 min   Charges:   PT Evaluation $Initial PT Evaluation Tier I: 1 Procedure     PT G Codes:          Vola Beneke 2014/07/15, 5:30 PM  [facility] PT [phone]

## 2014-07-08 NOTE — Progress Notes (Signed)
Palliative Medicine consult received and chart reviewed. Patient with advanced dementia although his prior level of functioning is not entirely clear from chart review. He appears to have advanced disease- he is non-verbal and does not follow commands despite correction of his electrolyte abnormalities. Placement of PEG tubes in patients with advanced dementia have not been shown to improve mortality but actually increase rates of infection, decubitus ulcers and aspiration. Careful hand feeding with aspiration precautions and gentle cueing and human presence has been shown to be most helpful and are associated with better outcomes and survival even in very advanced dementia. Patient is a DNR. No family at bedside-will schedule a meeting with them at earliest possible time we have a provider available.   Anderson Malta, DO Palliative Medicine [phone]

## 2014-07-09 LAB — GLUCOSE, CAPILLARY
Glucose-Capillary: 138 mg/dL — ABNORMAL HIGH (ref 70–99)
Glucose-Capillary: 196 mg/dL — ABNORMAL HIGH (ref 70–99)
Glucose-Capillary: 255 mg/dL — ABNORMAL HIGH (ref 70–99)
Glucose-Capillary: 257 mg/dL — ABNORMAL HIGH (ref 70–99)
Glucose-Capillary: 335 mg/dL — ABNORMAL HIGH (ref 70–99)

## 2014-07-09 MED ORDER — INSULIN ASPART 100 UNIT/ML ~~LOC~~ SOLN
0.0000 [IU] | Freq: Three times a day (TID) | SUBCUTANEOUS | Status: DC
  Administered 2014-07-09: 5 [IU] via SUBCUTANEOUS
  Administered 2014-07-09 – 2014-07-10 (×2): 3 [IU] via SUBCUTANEOUS
  Administered 2014-07-10: 8 [IU] via SUBCUTANEOUS
  Administered 2014-07-10: 3 [IU] via SUBCUTANEOUS
  Administered 2014-07-11: 5 [IU] via SUBCUTANEOUS

## 2014-07-09 MED ORDER — FUROSEMIDE 40 MG PO TABS
40.0000 mg | ORAL_TABLET | Freq: Every day | ORAL | Status: DC
  Administered 2014-07-09 – 2014-07-10 (×2): 40 mg via ORAL
  Filled 2014-07-09 (×2): qty 1

## 2014-07-09 MED ORDER — DONEPEZIL HCL 10 MG PO TABS
10.0000 mg | ORAL_TABLET | Freq: Every day | ORAL | Status: DC
  Filled 2014-07-09 (×3): qty 1

## 2014-07-09 NOTE — Consult Note (Signed)
Palliative Medicine Consult  80 yo man with advanced dementia, admitted from [facility] where he was staying as a respite only patient while his wife Jake Rollins had knee replacement surgery-he is a [facility] patient- they may not know he is hospitalized.   His wife states that she usually helps him with all of his meals and prepares foods that he likes- she says "I dont know what they did to him at [facility] never had this problem at home". She also fills a large pitcher of ice water for him at home and makes him drink fluids throughout day at home- he probably got dehydrated because at SNF they were not offering water and he was disoriented and off his normal routine. This is his first admission.   Summary:  1. Please Notify [facility] ASAP of his hospitalization 2. DNR 3. Will need to return to respite care- needs to be offered fluids/full assist with meals- no PEG per wife. 4. Wife is primary decision maker his daughter Jake Rollins is very involved in his care but she is from a previous marriage.   Unfortunately this man was admitted with severe dehydration related to his change in environment while his wife had knee replacement-plan is for him to return back home once she has recovered- [facility] is assisting with care.  Anderson Malta, DO Palliative Medicine [phone]

## 2014-07-09 NOTE — Progress Notes (Signed)
Patient Demographics  Jake Rollins, is a 80 y.o. male, DOB - August 12, 1934, [id number]  Admit date - 07/04/2014   Admitting Physician Jake Peer, MD  Outpatient Primary MD for the patient is Jake Rollins, ERIC, MD  LOS - 5   Chief Complaint  Patient presents with  . Altered Mental Status        Subjective:   Jake Rollins today has, No headache, No chest pain, No abdominal pain - No Nausea, No new weakness tingling or numbness, No Cough - SOB. Unreliable historian.  Assessment & Plan     1. HONK with Severe Dehydration - Hypernatremia - AKI- Shock - much improved with IV fluids, now on dysphagia 3 diet with sliding scale insulin and Lantus. Stopped IV fluids..    2. AK I. Resolved with hydration.    3. DM type II. Stable on sliding scale evident Lantus for better control. May require insulin on discharge.  Lab Results  Component Value Date   HGBA1C 13.4* 07/04/2014    CBG (last 3)   Recent Labs  07/08/14 2358 07/09/14 0412 07/09/14 0755  GLUCAP 158* 138* 196*     4. Advanced dementia, dysphagia. She required NG tube for feeding, speech following, now placed on dysphagia 3 diet removed NG tube. Continue Aricept.    5. Anemia of chronic disease. Stable.    6. Shock due to hypovolemia plus UTI. Placed on Rocephin for a total of 4 doses, cultures noted, steroid stopped.      Code Status: DNR  Family Communication: wife and sisters  Disposition Plan: SNF with likely hospice for palliative care on board   Procedures IR NG tube placement.   Consults  PCCM, IR palliative care   Medications  Scheduled Meds: . antiseptic oral rinse  7 mL Mouth Rinse q12n4p  . aspirin  81 mg Oral Daily  . cefTRIAXone (ROCEPHIN)  IV  1 g Intravenous Q24H  . chlorhexidine  15 mL Mouth  Rinse BID  . divalproex  500 mg Oral Q12H  . donepezil  10 mg Oral QHS  . free water  200 mL Per Tube Q6H  . furosemide  40 mg Oral Daily  . insulin aspart  0-15 Units Subcutaneous TID WC  . insulin glargine  15 Units Subcutaneous Daily  . isosorbide dinitrate  30 mg Per Tube BID   Continuous Infusions: . feeding supplement (JEVITY 1.2 CAL) 1,000 mL (07/09/14 0508)   PRN Meds:.acetaminophen, sodium chloride  DVT Prophylaxis  SCD  Lab Results  Component Value Date   PLT 79* 07/07/2014    Antibiotics     Anti-infectives   Start     Dose/Rate Route Frequency Ordered Stop   07/07/14 1000  cefTRIAXone (ROCEPHIN) 1 g in dextrose 5 % 50 mL IVPB     1 g 100 mL/hr over 30 Minutes Intravenous Every 24 hours 07/07/14 0935     07/04/14 1700  ceFEPIme (MAXIPIME) 1 g in dextrose 5 % 50 mL IVPB  Status:  Discontinued     1 g 100 mL/hr over 30 Minutes Intravenous Every 24 hours 07/04/14 1623 07/05/14 1023   07/04/14 1500  cefTRIAXone (ROCEPHIN) 1 g in dextrose 5 % 50 mL IVPB     1 g  100 mL/hr over 30 Minutes Intravenous  Once 07/04/14 1445 07/04/14 1606          Objective:   Filed Vitals:   07/08/14 1146 07/08/14 1804 07/08/14 2014 07/09/14 0429  BP: 108/71 126/69 132/71 117/69  Pulse: 92 79 80 81  Temp: 98.6 F (37 C) 97.9 F (36.6 C) 98.6 F (37 C) 98.1 F (36.7 C)  TempSrc: Oral Oral Axillary Axillary  Resp: Height:      Weight:      SpO2: 100% 100% 100% 98%    Wt Readings from Last 3 Encounters:  07/06/14 88.5 kg (195 lb 1.7 oz)  08/02/13 85.458 kg (188 lb 6.4 oz)  05/16/13 91.309 kg (201 lb 4.8 oz)     Intake/Output Summary (Last 24 hours) at 07/09/14 0944 Last data filed at 07/09/14 0523  Gross per 24 hour  Intake 490.67 ml  Output   1750 ml  Net -1259.33 ml     Physical Exam  Awake but confused, No new F.N deficits, Normal affect [place].AT,PERRAL Supple Neck,No JVD, No cervical lymphadenopathy appriciated.  Symmetrical Chest wall movement,  Good air movement bilaterally, CTAB RRR,No Gallops,Rubs or new Murmurs, No Parasternal Heave +ve B.Sounds, Abd Soft, No tenderness, No organomegaly appriciated, No rebound - guarding or rigidity. No Cyanosis, Clubbing , trace of edema and third spacing in all 4 extremities upper more than lower  , No new Rash or bruise    Data Review   Micro Results Recent Results (from the past 240 hour(s))  URINE CULTURE     Status: None   Collection Time    07/04/14  1:56 PM      Result Value Ref Range Status   Specimen Description URINE, CATHETERIZED   Final   Special Requests ADDED [id number]   Final   Culture  Setup Time     Final   Value: 07/05/2014 04:46     Performed at [facility]   Colony Count     Final   Value: >=100,000 COLONIES/ML     Performed at [facility]   Culture     Final   Value: PROTEUS MIRABILIS     Performed at [facility]   Report Status 07/07/2014 FINAL   Final   Organism ID, Bacteria PROTEUS MIRABILIS   Final  MRSA PCR SCREENING     Status: None   Collection Time    07/04/14  6:09 PM      Result Value Ref Range Status   MRSA by PCR NEGATIVE  NEGATIVE Final   Comment:            The GeneXpert MRSA Assay (FDA     approved for NASAL specimens     only), is one component of a     comprehensive MRSA colonization     surveillance program. It is not     intended to diagnose MRSA     infection nor to guide or     monitor treatment for     MRSA infections.    Radiology Reports Dg Basil Dess Tube Plc W/fl-no Rad  07/07/2014   CLINICAL DATA: replace naso gastric tube per Dr Jefm Petty fluoro  guidance   NASO G TUBE PLACEMENT WITH FLUORO  Fluoroscopy was utilized by the requesting physician.  No radiographic  interpretation.     CBC  Recent Labs Lab 07/04/14 1428 07/04/14 1925 07/05/14 0306 07/06/14 0411 07/07/14 0605  WBC 9.7 8.0 10.2 8.2  9.7  HGB 11.8* 10.8* 11.3* 9.3* 10.5*  HCT 38.5* 34.8* 37.4* 29.9* 32.4*  PLT 74* 65* 86*  55* 79*  MCV 97.2 97.5 98.7 95.2 92.0  MCH 29.8 30.3 29.8 29.6 29.8  MCHC 30.6 31.0 30.2 31.1 32.4  RDW 15.3 15.2 15.4 15.2 14.4    Chemistries   Recent Labs Lab 07/04/14 1428 07/04/14 1925  07/05/14 0306  07/05/14 1611 07/05/14 2250 07/06/14 0411 07/07/14 0605 07/08/14 0435  NA 167*  --   < > 170*  < > 164* 160* 156* 149* 144  K 5.1  --   < > 3.7  < > 4.2 3.9 4.0 4.3 4.5  CL 127*  --   < > >130*  < > >130* 130* 126* 118* 113*  CO2 21  --   < > 20  < > 18* 20  GLUCOSE 534*  --   < > 126*  < > 97 192* 261* 144* 354*  BUN 118*  --   < > 103*  < > 88* 84* 80* 66* 56*  CREATININE 5.21* 4.93*  < > 4.24*  < > 3.73* 3.30* 3.07* 2.21* 2.04*  CALCIUM 9.3  --   < > 8.2*  < > 8.1* 8.2* 7.9* 8.2* 8.4  MG  --  3.2*  --  2.7*  --   --   --  2.3  --  2.3  AST 13  --   --  19  --   --   --   --   --   --   ALT 13  --   --  14  --   --   --   --   --   --   ALKPHOS 91  --   --  86  --   --   --   --   --   --   BILITOT 0.4  --   --  0.3  --   --   --   --   --   --   < > = values in this interval not displayed. ------------------------------------------------------------------------------------------------------------------ estimated creatinine clearance is 33.3 ml/min (by C-G formula based on Cr of 2.04). ------------------------------------------------------------------------------------------------------------------ No results found for this basename: HGBA1C,  in the last 72 hours ------------------------------------------------------------------------------------------------------------------ No results found for this basename: CHOL, HDL, LDLCALC, TRIG, CHOLHDL, LDLDIRECT,  in the last 72 hours ------------------------------------------------------------------------------------------------------------------ No results found for this basename: TSH, T4TOTAL, FREET3, T3FREE, THYROIDAB,  in the last 72  hours ------------------------------------------------------------------------------------------------------------------ No results found for this basename: VITAMINB12, FOLATE, FERRITIN, TIBC, IRON, RETICCTPCT,  in the last 72 hours  Coagulation profile  Recent Labs Lab 07/05/14 0306  INR 1.21    No results found for this basename: DDIMER,  in the last 72 hours  Cardiac Enzymes  Recent Labs Lab 07/04/14 1925 07/04/14 2149 07/05/14 0306  TROPONINI <0.30 <0.30 <0.30   ------------------------------------------------------------------------------------------------------------------ No components found with this basename: POCBNP,      Time Spent in minutes 35   Susa Raring K M.D on 07/09/2014 at 9:44 AM  Between 7am to 7pm - Pager - [phone]  After 7pm go to [email or web address] - password [email or web address]  And look for the night coverage person covering for me after hours  [facility] Office  [phone]   **Disclaimer: This note may have been dictated with voice recognition software. Similar sounding words can inadvertently be transcribed and this note may contain transcription errors which may not have been corrected upon  publication of note.**

## 2014-07-10 LAB — GLUCOSE, CAPILLARY
GLUCOSE-CAPILLARY: 284 mg/dL — AB (ref 70–99)
Glucose-Capillary: 195 mg/dL — ABNORMAL HIGH (ref 70–99)
Glucose-Capillary: 163 mg/dL — ABNORMAL HIGH (ref 70–99)

## 2014-07-10 MED ORDER — INSULIN GLARGINE 100 UNIT/ML ~~LOC~~ SOLN: 25.0000 [IU] | Freq: Every day | SUBCUTANEOUS | Status: DC

## 2014-07-10 MED ORDER — INSULIN GLARGINE 100 UNIT/ML ~~LOC~~ SOLN
10.0000 [IU] | Freq: Once | SUBCUTANEOUS | Status: AC
  Administered 2014-07-10: 10 [IU] via SUBCUTANEOUS
  Filled 2014-07-10: qty 0.1

## 2014-07-10 MED ORDER — INSULIN ASPART 100 UNIT/ML ~~LOC~~ SOLN: SUBCUTANEOUS | Status: DC

## 2014-07-10 MED ORDER — INFLUENZA VAC SPLIT QUAD 0.5 ML IM SUSY
0.5000 mL | PREFILLED_SYRINGE | INTRAMUSCULAR | Status: DC
Start: 2014-07-11 — End: 2014-07-11
  Filled 2014-07-10: qty 0.5

## 2014-07-10 MED ORDER — INSULIN GLARGINE 100 UNIT/ML ~~LOC~~ SOLN
25.0000 [IU] | Freq: Every day | SUBCUTANEOUS | Status: DC
  Filled 2014-07-10: qty 0.25

## 2014-07-10 MED ORDER — GLUCERNA 1.2 CAL PO LIQD
237.0000 mL | Freq: Three times a day (TID) | ORAL | Status: DC
Start: 2014-07-10 — End: 2016-07-08

## 2014-07-10 MED ORDER — AMLODIPINE BESYLATE 10 MG PO TABS: 10.0000 mg | ORAL_TABLET | Freq: Every day | ORAL | Status: DC

## 2014-07-10 MED ORDER — AMLODIPINE BESYLATE 10 MG PO TABS
10.0000 mg | ORAL_TABLET | Freq: Every day | ORAL | Status: DC
  Administered 2014-07-10: 10 mg via ORAL
  Filled 2014-07-10 (×2): qty 1

## 2014-07-10 NOTE — Discharge Instructions (Signed)
Follow with Primary MD [name], ERIC, MD in 7 days   Get CBC, CMP, 2 view Chest X ray checked  by Primary MD next visit.    Activity: As tolerated with Full fall precautions use walker/cane & assistance as needed   Disposition SNF   Diet: Dysphagia 3 Heart Healthy with feeding assistance and aspiration precautions as needed.  For Heart failure patients - Check your Weight same time everyday, if you gain over 2 pounds, or you develop in leg swelling, experience more shortness of breath or chest pain, call your Primary MD immediately. Follow Cardiac Low Salt Diet and 1.8 lit/day fluid restriction.   On your next visit with her primary care physician please Get Medicines reviewed and adjusted.  Please request your Prim.MD to go over all Hospital Tests and Procedure/Radiological results at the follow up, please get all Hospital records sent to your Prim MD by signing hospital release before you go home.   If you experience worsening of your admission symptoms, develop shortness of breath, life threatening emergency, suicidal or homicidal thoughts you must seek medical attention immediately by calling 911 or calling your MD immediately  if symptoms less severe.  You Must read complete instructions/literature along with all the possible adverse reactions/side effects for all the Medicines you take and that have been prescribed to you. Take any new Medicines after you have completely understood and accpet all the possible adverse reactions/side effects.   Do not drive, operating heavy machinery, perform activities at heights, swimming or participation in water activities or provide baby sitting services if your were admitted for syncope or siezures until you have seen by Primary MD or a Neurologist and advised to do so again.  Do not drive when taking Pain medications.    Do not take more than prescribed Pain, Sleep and Anxiety Medications  Special Instructions: If you have smoked or chewed  Tobacco  in the last 2 yrs please stop smoking, stop any regular Alcohol  and or any Recreational drug use.  Wear Seat belts while driving.   Please note  You were cared for by a hospitalist during your hospital stay. If you have any questions about your discharge medications or the care you received while you were in the hospital after you are discharged, you can call the unit and asked to speak with the hospitalist on call if the hospitalist that took care of you is not available. Once you are discharged, your primary care physician will handle any further medical issues. Please note that NO REFILLS for any discharge medications will be authorized once you are discharged, as it is imperative that you return to your primary care physician (or establish a relationship with a primary care physician if you do not have one) for your aftercare needs so that they can reassess your need for medications and monitor your lab values.

## 2014-07-10 NOTE — Progress Notes (Addendum)
Pt placed back in safety mitts, no family present. Pt acting aggressively toward staff during patient care and repositioning. Currently patient is easily redirected resting in bed. MD notified via amion. Will continue to monitor.

## 2014-07-10 NOTE — Clinical Social Work Note (Signed)
Consult received for SNF placement. Call placed to wife and she in agreement with [facility] re[facility] for patient - full assessment to follow. Patient is a Nurse, learning disability. Jake Rollins will discharge to [facility] on 07/11/14.  Genelle Bal, MSW, LCSW [phone]

## 2014-07-10 NOTE — Progress Notes (Signed)
Speech Language Pathology Treatment: Dysphagia  Patient Details Name: Xzayvion Vaeth MRN: [id number] DOB: 1934/02/10 Today's Date: 07/10/2014 Time: 1030-1055 SLP Time Calculation (min): 25 min  Assessment / Plan / Recommendation Clinical Impression  SLP provided skilled observation of pt with regular diet. He is able to self feed and swallow appears within normal limits. SLP educated family about strategies to use if dysphagia progresses with dementia. No further services are warranted at this time, SLP signing off.   HPI HPI: 80 year old male with a history of advanced Alzheimer's dementia, coronary artery disease status post MI in 2008, hypertension, AAA, chronic kidney disease stage IV, type 2 diabetes with neuropathy nephropathy constant ER for evaluation of altered mental status. Pt found to have profound hypernatremia, hypovolemic shock, dysphagia due to AMS and has been on tube feeding. Hopeful for NG and restraints to be d/c'd.    Pertinent Vitals Pain Assessment: No/denies pain  SLP Plan  Discharge SLP treatment due to (comment) (swallow within normal limits)    Recommendations Diet recommendations: Regular;Thin liquid Liquids provided via: Cup Supervision: Staff to assist with self feeding              Follow up Recommendations: Skilled Nursing facility Plan: Discharge SLP treatment due to (comment) (swallow within normal limits)    GO     Barkley Bruns 07/10/2014, 2:12 PM  Supervised and reviewed by Harlon Ditty MA CCC-SLP

## 2014-07-10 NOTE — Progress Notes (Signed)
Foley catheter d/c'd at 1242. Tolerated well. 600 cc clear yellow urine in bag. Will continue to monitor.

## 2014-07-10 NOTE — Discharge Summary (Addendum)
Jake Rollins, is a 80 y.o. male  DOB 03-20-1934  MRN [id number].  Admission date:  07/04/2014  Admitting Physician  Leslye Peer, MD  Discharge Date:  07/11/2014   Primary MD  Willey Blade, MD  Recommendations for primary care physician for things to follow:   Check CBC and CMP in a week. Aspiration precautions. DO NOT RESUSCITATE focus of care comfort with gentle medical treatment.   Admission Diagnosis  Other specified hypotension [458.8] Hypernatremia [276.0] Hyperglycemia [790.29] Acute kidney injury [584.9] Dementia, without behavioral disturbance [294.20] Chronic renal failure, stage 4 (severe) [585.4] Urinary tract infection without hematuria, site unspecified [599.0] Altered mental status, unspecified altered mental status type [780.97]   Discharge Diagnosis  Other specified hypotension [458.8] Hypernatremia [276.0] Hyperglycemia [790.29] Acute kidney injury [584.9] Dementia, without behavioral disturbance [294.20] Chronic renal failure, stage 4 (severe) [585.4] Urinary tract infection without hematuria, site unspecified [599.0] Altered mental status, unspecified altered mental status type [780.97]    Active Problems:   Altered mental status   Sepsis   Shock circulatory   Encephalopathy acute   Hyperosmolar non-ketotic state in patient with type 2 diabetes mellitus   Hypernatremia   AKI (acute kidney injury)      Past Medical History  Diagnosis Date  . 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 Hypertension   . Coronary artery disease 2008     LAD of roughly 40% and 50%; small (<52mm) D2 - 90% ostial; small OM3 - ostial 90%  . Alzheimer disease     Moderate dementia  . Dyslipidemia   . Diabetic peripheral neuropathy associated with type 2 diabetes mellitus   . CKD  (chronic kidney disease), stage III   . AAA (abdominal aortic aneurysm) without rupture     Mild infrarenal to play with left common iliac ears and    Past Surgical History  Procedure Laterality Date  . Cardiac catheterization  2008    40-50% LAD lesions, 90% ostial D2 (less than 2 mm) 80% ostial OM 3 (less than 2 mm)  . Nm myoview ltd  February 2010    No ischemia or infarction  . Doppler echocardiography  April 2009    Borderline concentric LVH, otherwise normal.  . Lower extremity artery dopplers      AAA  minimal dilation just proximal to the iliac bifurcation measuring 3.3 x 3.45 cm.  Left common iliac was aneurysmal dilatation measuring 2.57-2.7 cm; ABIs: 017) 1.0 left.  Bilateral arteries with 3 vessel runoff        History of present illness and  Hospital Course:     Kindly see H&P for history of present illness and admission details, please review complete Labs, Consult reports and Test reports for all details in brief  HPI  from the history and physical done on the day of admission  80 year old male with a history of advanced Alzheimer's dementia, coronary artery disease status post MI in 2008, hypertension, AAA, chronic kidney disease stage IV,  type 2 diabetes with neuropathy nephropathy constant ER for evaluation of altered mental status. The patient is on being more confused compared to his baseline with a reported fall. His CBG was elevated and was found to be greater than 400. Patient is highly confused and does not reveal any history. He was found to be aggressive with the nursing staff. Temperature upon presentation was 96.2. Concern for sepsis because of urinary tract infection, CBG 534, BUN 118, creatinine 5.21.   Hospital Course    1. HONK with Severe Dehydration - Hypernatremia - AKI- Shock - much improved with IV fluids, now on dysphagia 3 diet with sliding scale insulin and Lantus. Remodeling CBGs closely adjust insulin dose as needed.    2. AK I. which  improved with hydration, will discontinue Lasix and ACE inhibitor upon discharge. Repeat BMP in a week. Monitor fluid status and diuretic need.   3. DM type II. Stable on sliding scale evident Lantus for better control. An A1c of 13, will place on Lantus sliding scale and discontinue oral medications. Monitor CBGs closely adjust insulin as needed.   4. Advanced dementia, dysphagia.He required NG tube for feeding, speech following, now placed on dysphagia 3 diet removed NG tube. Continue Aricept. Will require followup by speech therapist, full feeding assistance and aspiration precautions.    5. Anemia of chronic disease. Stable.    6. Shock due to hypovolemia plus UTI. Placed on Rocephin for a total of 4 doses which he has completed, cultures noted, steroids stopped.    Discharge Condition: Stable poor long term prognosis focus on keeping him comfortable   Follow UP  Follow-up Information   Follow up with [name], ERIC, MD. Schedule an appointment as soon as possible for a visit in 1 week.   Specialty:  Internal Medicine   Contact information:   [facility] [address] [place] [address] [phone]         Discharge Instructions  and  Discharge Medications          Discharge Instructions   Discharge instructions    Complete by:  As directed   Follow with Primary MD [name], ERIC, MD in 7 days   Get CBC, CMP, 2 view Chest X ray checked  by Primary MD next visit.    Activity: As tolerated with Full fall precautions use walker/cane & assistance as needed   Disposition SNF   Diet: Dysphagia 3, thin liquids, medications and pured thick medium. Heart Healthy with feeding assistance and aspiration precautions as needed.   Heart failure patients, medications and pured thick medium - Check your Weight same time everyday, if you gain over 2 pounds, or you develop in leg swelling, experience more shortness of breath or chest pain, call your  Primary MD immediately. Follow Cardiac Low Salt Diet and 1.8 lit/day fluid restriction.   On your next visit with her primary care physician please Get Medicines reviewed and adjusted.  Please request your Prim.MD to go over all Hospital Tests and Procedure/Radiological results at the follow up, please get all Hospital records sent to your Prim MD by signing hospital release before you go home.   If you experience worsening of your admission symptoms, develop shortness of breath, life threatening emergency, suicidal or homicidal thoughts you must seek medical attention immediately by calling 911 or calling your MD immediately  if symptoms less severe.  You Must read complete instructions/literature along with all the possible adverse reactions/side effects for all the Medicines  you take and that have been prescribed to you. Take any new Medicines after you have completely understood and accpet all the possible adverse reactions/side effects.   Do not drive, operating heavy machinery, perform activities at heights, swimming or participation in water activities or provide baby sitting services if your were admitted for syncope or siezures until you have seen by Primary MD or a Neurologist and advised to do so again.  Do not drive when taking Pain medications.    Do not take more than prescribed Pain, Sleep and Anxiety Medications  Special Instructions: If you have smoked or chewed Tobacco  in the last 2 yrs please stop smoking, stop any regular Alcohol  and or any Recreational drug use.  Wear Seat belts while driving.   Please note  You were cared for by a hospitalist during your hospital stay. If you have any questions about your discharge medications or the care you received while you were in the hospital after you are discharged, you can call the unit and asked to speak with the hospitalist on call if the hospitalist that took care of you is not available. Once you are discharged, your  primary care physician will handle any further medical issues. Please note that NO REFILLS for any discharge medications will be authorized once you are discharged, as it is imperative that you return to your primary care physician (or establish a relationship with a primary care physician if you do not have one) for your aftercare needs so that they can reassess your need for medications and monitor your lab values.     Increase activity slowly    Complete by:  As directed             Medication List    STOP taking these medications       benazepril 10 MG tablet  Commonly known as:  LOTENSIN     furosemide 40 MG tablet  Commonly known as:  LASIX     glimepiride 2 MG tablet  Commonly known as:  AMARYL     sitaGLIPtin 50 MG tablet  Commonly known as:  JANUVIA      TAKE these medications       acetaminophen 325 MG tablet  Commonly known as:  TYLENOL  Take 650 mg by mouth once as needed for mild pain.     amLODipine 10 MG tablet  Commonly known as:  NORVASC  Take 1 tablet (10 mg total) by mouth daily.     aspirin 81 MG chewable tablet  Chew 81 mg by mouth daily.     cholecalciferol 1000 UNITS tablet  Commonly known as:  VITAMIN D  Take 1,000 Units by mouth daily.     divalproex 125 MG capsule  Commonly known as:  DEPAKOTE SPRINKLE  Take 500 mg by mouth 2 (two) times daily. Sprinkle on food BID     donepezil 10 MG tablet  Commonly known as:  ARICEPT  Take 10 mg by mouth at bedtime.     feeding supplement (GLUCERNA 1.2 CAL) Liqd  Place 237 mLs into feeding tube 3 (three) times daily.     gabapentin 100 MG capsule  Commonly known as:  NEURONTIN  Take 100 mg by mouth daily.     insulin aspart 100 UNIT/ML injection  Commonly known as:  novoLOG  Before each meal 3 times a day, 140-199 - 2 units, 200-250 - 4 units, 251-299 - 6 units,  300-349 - 8 units,  350 or  above 10 units.     insulin glargine 100 UNIT/ML injection  Commonly known as:  LANTUS  Inject 0.25 mLs  (25 Units total) into the skin daily.     isosorbide mononitrate 60 MG 24 hr tablet  Commonly known as:  IMDUR  Take 60 mg by mouth every morning.     metoprolol succinate 25 MG 24 hr tablet  Commonly known as:  TOPROL XL  Take 1 tablet (25 mg total) by mouth daily.     nitroGLYCERIN 0.4 MG SL tablet  Commonly known as:  NITROSTAT  Place 1 tablet (0.4 mg total) under the tongue every 5 (five) minutes as needed for chest pain.          Diet and Activity recommendation: See Discharge Instructions above   Consults obtained - palliative care, speech, PCM   Major procedures and Radiology Reports - PLEASE review detailed and final reports for all details, in brief -       Dg Chest Port 1 View  07/05/2014   CLINICAL DATA:  Line placement.  NG tube placement.  EXAM: PORTABLE CHEST - 1 VIEW  COMPARISON:  07/04/2014  FINDINGS: Interval placement of a left central venous catheter. Tip localizes over the mid SVC region. No pneumothorax. Also placement of an enteric tube with tip in the left upper quadrant consistent with location in the stomach. Shallow inspiration. Heart size and pulmonary vascularity are normal. No focal airspace disease in the lungs.  IMPRESSION: Appliances appear to be in satisfactory location.   Electronically Signed   By: Burman Nieves M.D.   On: 07/05/2014 05:31   Dg Chest Port 1 View  07/04/2014   CLINICAL DATA:  Shortness of breath.  EXAM: PORTABLE CHEST - 1 VIEW  COMPARISON:  09/13/2009  FINDINGS: The heart size and mediastinal contours are within normal limits. Both lungs are clear. Degenerative changes in the thoracic spine and at the left shoulder.  IMPRESSION: No acute abnormalities of the chest.   Electronically Signed   By: Geanie Cooley M.D.   On: 07/04/2014 15:32   Dg Abd Portable 1v  07/05/2014   CLINICAL DATA:  NG tube placement  EXAM: PORTABLE ABDOMEN - 1 VIEW  COMPARISON:  None.  FINDINGS: Enteric tube tip localizes to the left upper quadrant medially  with proximal side hole at over the lower chest. Tip is likely at or just below the EG junction with proximal side hole likely in the esophagus.  IMPRESSION: Enteric tube tip localizes over the EG junction with proximal side hole probably in the distal esophagus.   Electronically Signed   By: Burman Nieves M.D.   On: 07/05/2014 03:36   Dg Vangie Bicker G Tube Plc W/fl-no Rad  07/07/2014   CLINICAL DATA: replace naso gastric tube per Dr Jefm Petty fluoro  guidance   NASO G TUBE PLACEMENT WITH FLUORO  Fluoroscopy was utilized by the requesting physician.  No radiographic  interpretation.     Micro Results      Recent Results (from the past 240 hour(s))  URINE CULTURE     Status: None   Collection Time    07/04/14  1:56 PM      Result Value Ref Range Status   Specimen Description URINE, CATHETERIZED   Final   Special Requests ADDED [id number]   Final   Culture  Setup Time     Final   Value: 07/05/2014 04:46     Performed at [facility]   Colony Count  Final   Value: >=100,000 COLONIES/ML     Performed at [facility]   Culture     Final   Value: PROTEUS MIRABILIS     Performed at [facility]   Report Status 07/07/2014 FINAL   Final   Organism ID, Bacteria PROTEUS MIRABILIS   Final  MRSA PCR SCREENING     Status: None   Collection Time    07/04/14  6:09 PM      Result Value Ref Range Status   MRSA by PCR NEGATIVE  NEGATIVE Final   Comment:            The GeneXpert MRSA Assay (FDA     approved for NASAL specimens     only), is one component of a     comprehensive MRSA colonization     surveillance program. It is not     intended to diagnose MRSA     infection nor to guide or     monitor treatment for     MRSA infections.       Today   Subjective:   Jake Rollins today is nonverbal appears to be in no distress.  Objective:   Blood pressure 138/70, pulse 76, temperature 97.8 F (36.6 C), temperature source Axillary, resp. rate 18, height 6'  (1.829 m), weight 88.9 kg (195 lb 15.8 oz), SpO2 100.00%.   Intake/Output Summary (Last 24 hours) at 07/11/14 1108 Last data filed at 07/11/14 0942  Gross per 24 hour  Intake    640 ml  Output    600 ml  Net     40 ml    Exam Awake but remains non verbal, No apparent F.N deficits, does not follow commands [place].AT,PERRAL Supple Neck,No JVD, No cervical lymphadenopathy appriciated.  Symmetrical Chest wall movement, Good air movement bilaterally, CTAB RRR,No Gallops,Rubs or new Murmurs, No Parasternal Heave +ve B.Sounds, Abd Soft, Non tender, No organomegaly appriciated, No rebound -guarding or rigidity. No Cyanosis, Clubbing or edema, No new Rash or bruise  Data Review   CBC w Diff: Lab Results  Component Value Date   WBC 9.7 07/07/2014   HGB 10.5* 07/07/2014   HCT 32.4* 07/07/2014   PLT 79* 07/07/2014   LYMPHOPCT 11* 08/19/2007   MONOPCT 11 08/19/2007   EOSPCT 5 08/19/2007   BASOPCT 1 08/19/2007    CMP: Lab Results  Component Value Date   NA 144 07/08/2014   NA 136* 09/30/2012   K 4.5 07/08/2014   CL 113* 07/08/2014   CO2 20 07/08/2014   BUN 56* 07/08/2014   CREATININE 2.04* 07/08/2014   GLU 85 09/30/2012   PROT 6.4 07/05/2014   ALBUMIN 2.5* 07/05/2014   BILITOT 0.3 07/05/2014   ALKPHOS 86 07/05/2014   AST 19 07/05/2014   ALT 14 07/05/2014  .   Total Time in preparing paper work, data evaluation and todays exam - 35 minutes  Leroy Sea M.D on 07/11/2014 at 11:08 AM  [facility] Office  [phone]   **Disclaimer: This note may have been dictated with voice recognition software. Similar sounding words can inadvertently be transcribed and this note may contain transcription errors which may not have been corrected upon publication of note.**

## 2014-07-11 LAB — GLUCOSE, CAPILLARY
Glucose-Capillary: 85 mg/dL (ref 70–99)
Glucose-Capillary: 229 mg/dL — ABNORMAL HIGH (ref 70–99)

## 2014-07-11 NOTE — Progress Notes (Signed)
Called to give report to staff of [facility] staff [facility]). Pt removed IV at previous time; pt also removed condom catheter earlier today. Pt to leave unit via stretcher by ambulance service.

## 2014-07-11 NOTE — Clinical Social Work Placement (Addendum)
Clinical Social Work Department CLINICAL SOCIAL WORK PLACEMENT NOTE 07/11/2014  Patient:  Jake Rollins, Jake Rollins  Account Number:  [id number] Admit date:  07/04/2014  Clinical Social Worker:  Genelle Bal, LCSW  Date/time:  07/11/2014 12:23 PM  Clinical Social Work is seeking post-discharge placement for this patient at the following level of care:   SKILLED NURSING   (*CSW will update this form in Epic as items are completed)     Patient/family provided with [facility] Department of Clinical Social Work's list of facilities offering this level of care within the geographic area requested by the patient (or if unable, by the patient's family).  07/10/2014  Patient/family informed of their freedom to choose among providers that offer the needed level of care, that participate in Medicare, Medicaid or managed care program needed by the patient, have an available bed and are willing to accept the patient.    Patient/family informed of [facility]' ownership interest in [facility], as well as of the fact that they are under no obligation to receive care at this facility.  PASARR submitted to EDS on 07/10/2014 PASARR number received on 07/10/2014  FL2 transmitted to all facilities in geographic area requested by pt/family on  07/10/2014 FL2 transmitted to all facilities within larger geographic area on   Patient informed that his/her managed care company has contracts with or will negotiate with  certain facilities, including the following:     Patient/family informed of bed offers received:  07/10/2014 Patient chooses bed at [facility] Physician recommends and patient chooses bed at    Patient to be transferred to [facility] on  07/11/2014 Patient to be transferred to facility by  Patient and family notified of transfer on 07/11/2014 Name of family member notified: Averi Cacioppo (wife)   The following physician request were entered in  Epic:   Additional Comments:

## 2014-07-11 NOTE — Clinical Social Work Psychosocial (Signed)
Clinical Social Work Department BRIEF PSYCHOSOCIAL ASSESSMENT 07/11/2014  Patient:  Jake Rollins, Jake Rollins     Account Number:  [id number]     Admit date:  07/04/2014  Clinical Social Worker:  Delmer Islam  Date/Time:  07/11/2014 12:02 PM  Referred by:  Physician  Date Referred:  07/07/2014 Referred for  SNF Placement   Other Referral:   Interview type:  Family Other interview type:    PSYCHOSOCIAL DATA Living Status:  WIFE Admitted from facility:   Level of care:   Primary support name:  Jake Rollins Primary support relationship to patient:  SPOUSE Degree of support available:   Good support    CURRENT CONCERNS Current Concerns  Post-Acute Placement   Other Concerns:    SOCIAL WORK ASSESSMENT / PLAN On 07/10/14 CSW talked by phone with patient's wife regarding discharge planning and recommendation of ST rehab. [facility] is a [facility] and wife made aware that the nursing facilities that work with [facility] are [place] and [facility]. Wife chose [facility].   Assessment/plan status:  No Further Intervention Required Other assessment/ plan:   Before talking with patient's wife on 9/14, CSW was contacted on 9/14 by [place], SW with the [facility] program [phone])  regarding discharge plans for patient. CSW informed that prior to hospitalization, patient was in respite care at [facility]. Patient's Medicaid number provided by [facility] and CSW also informed that they can provide transportation if appropriate.   Information/referral to community resources:   None needed or requested at this time.    PATIENT'S/FAMILY'S RESPONSE TO PLAN OF CARE: Jake Rollins pleasant and receptive to SNF placement for patient.

## 2014-07-12 LAB — GLUCOSE, CAPILLARY
GLUCOSE-CAPILLARY: 85 mg/dL (ref 70–99)
Glucose-Capillary: 274 mg/dL — ABNORMAL HIGH (ref 70–99)

## 2014-07-12 NOTE — Progress Notes (Signed)
CARE MANAGEMENT NOTE 07/12/2014  Patient:  Jake Rollins, Jake Rollins   Account Number:  [id number]  Date Initiated:  07/05/2014  Documentation initiated by:  [facility]  Subjective/Objective Assessment:   Admitted with AMS - Sepsis     Action/Plan:   Anticipated DC Date:  07/12/2014   Anticipated DC Plan:  SKILLED NURSING FACILITY  In-house referral  Clinical Social Worker      DC Planning Services  CM consult      Choice offered to / List presented to:             Status of service:  Completed, signed off Medicare Important Message given?  YES (If response is "NO", the following Medicare IM given date fields will be blank) Date Medicare IM given:  07/07/2014 Medicare IM given by:  [facility] Date Additional Medicare IM given:  07/10/2014 Additional Medicare IM given by:  Isidoro Donning  Discharge Disposition:  SKILLED NURSING FACILITY  Per UR Regulation:  Reviewed for med. necessity/level of care/duration of stay  If discussed at Long Length of Stay Meetings, dates discussed:    Comments:  ContactSchuyler, [place] [phone]                [facility] Daughter [phone]  07/11/2013  [address] RN, [place] [phone] plan for discharge to [facility] SN[facility].  07-05-14 2pm Avie Arenas, RNBSN [phone] Spoke with two sisters in room.  [place]ife in [place] for rehab.  [facility] and when wife home goes home to her care, however since she is rehab he has been going [facility] [place] [facility]  [place].   May need higher level on discharge.  SW consult placed.

## 2015-04-25 ENCOUNTER — Other Ambulatory Visit: Payer: Self-pay | Admitting: Family Medicine

## 2015-04-25 ENCOUNTER — Ambulatory Visit
Admission: RE | Admit: 2015-04-25 | Discharge: 2015-04-25 | Disposition: A | Discharge status: Home / Self Care | Payer: No Typology Code available for payment source | Source: Ambulatory Visit | Attending: Family Medicine | Admitting: Family Medicine

## 2015-04-25 DIAGNOSIS — Z8739 Personal history of other diseases of the musculoskeletal system and connective tissue: Secondary | ICD-10-CM

## 2016-07-08 ENCOUNTER — Encounter (HOSPITAL_COMMUNITY): Payer: Self-pay | Admitting: Emergency Medicine

## 2016-07-08 ENCOUNTER — Emergency Department (HOSPITAL_COMMUNITY): Payer: Medicare (Managed Care)

## 2016-07-08 ENCOUNTER — Emergency Department (HOSPITAL_COMMUNITY)
Admission: EM | Admit: 2016-07-08 | Discharge: 2016-07-08 | Disposition: A | Discharge status: Home / Self Care | Payer: Medicare (Managed Care) | Attending: Emergency Medicine | Admitting: Emergency Medicine

## 2016-07-08 DIAGNOSIS — Z794 Long term (current) use of insulin: Secondary | ICD-10-CM | POA: Insufficient documentation

## 2016-07-08 DIAGNOSIS — I2510 Atherosclerotic heart disease of native coronary artery without angina pectoris: Secondary | ICD-10-CM | POA: Insufficient documentation

## 2016-07-08 DIAGNOSIS — Y92129 Unspecified place in nursing home as the place of occurrence of the external cause: Secondary | ICD-10-CM | POA: Diagnosis not present

## 2016-07-08 DIAGNOSIS — Y939 Activity, unspecified: Secondary | ICD-10-CM | POA: Diagnosis not present

## 2016-07-08 DIAGNOSIS — Y999 Unspecified external cause status: Secondary | ICD-10-CM | POA: Insufficient documentation

## 2016-07-08 DIAGNOSIS — I129 Hypertensive chronic kidney disease with stage 1 through stage 4 chronic kidney disease, or unspecified chronic kidney disease: Secondary | ICD-10-CM | POA: Diagnosis not present

## 2016-07-08 DIAGNOSIS — Z87891 Personal history of nicotine dependence: Secondary | ICD-10-CM | POA: Diagnosis not present

## 2016-07-08 DIAGNOSIS — W19XXXA Unspecified fall, initial encounter: Secondary | ICD-10-CM

## 2016-07-08 DIAGNOSIS — G309 Alzheimer's disease, unspecified: Secondary | ICD-10-CM | POA: Insufficient documentation

## 2016-07-08 DIAGNOSIS — S0081XA Abrasion of other part of head, initial encounter: Secondary | ICD-10-CM

## 2016-07-08 DIAGNOSIS — Z79899 Other long term (current) drug therapy: Secondary | ICD-10-CM | POA: Insufficient documentation

## 2016-07-08 DIAGNOSIS — E1122 Type 2 diabetes mellitus with diabetic chronic kidney disease: Secondary | ICD-10-CM | POA: Insufficient documentation

## 2016-07-08 DIAGNOSIS — W07XXXA Fall from chair, initial encounter: Secondary | ICD-10-CM | POA: Diagnosis not present

## 2016-07-08 DIAGNOSIS — S0181XA Laceration without foreign body of other part of head, initial encounter: Secondary | ICD-10-CM | POA: Diagnosis not present

## 2016-07-08 DIAGNOSIS — N183 Chronic kidney disease, stage 3 (moderate): Secondary | ICD-10-CM | POA: Diagnosis not present

## 2016-07-08 DIAGNOSIS — N390 Urinary tract infection, site not specified: Secondary | ICD-10-CM | POA: Insufficient documentation

## 2016-07-08 DIAGNOSIS — S0993XA Unspecified injury of face, initial encounter: Secondary | ICD-10-CM | POA: Diagnosis present

## 2016-07-08 LAB — CBC WITH DIFFERENTIAL/PLATELET
Basophils Absolute: 0 10*3/uL (ref 0.0–0.1)
Basophils Relative: 0 %
EOS ABS: 0.2 10*3/uL (ref 0.0–0.7)
EOS PCT: 3 %
HEMATOCRIT: 35.5 % — AB (ref 39.0–52.0)
HEMOGLOBIN: 11.5 g/dL — AB (ref 13.0–17.0)
LYMPHS PCT: 28 %
Lymphs Abs: 1.9 10*3/uL (ref 0.7–4.0)
MCH: 30.7 pg (ref 26.0–34.0)
MCHC: 32.4 g/dL (ref 30.0–36.0)
MCV: 94.9 fL (ref 78.0–100.0)
MONO ABS: 0.5 10*3/uL (ref 0.1–1.0)
Monocytes Relative: 7 %
NEUTROS PCT: 62 %
Neutro Abs: 4.3 10*3/uL (ref 1.7–7.7)
Platelets: 111 10*3/uL — ABNORMAL LOW (ref 150–400)
RBC: 3.74 MIL/uL — ABNORMAL LOW (ref 4.22–5.81)
RDW: 13.7 % (ref 11.5–15.5)
WBC: 6.9 10*3/uL (ref 4.0–10.5)

## 2016-07-08 LAB — URINALYSIS, ROUTINE W REFLEX MICROSCOPIC
BILIRUBIN URINE: NEGATIVE
GLUCOSE, UA: 100 mg/dL — AB
KETONES UR: NEGATIVE mg/dL
NITRITE: NEGATIVE
Protein, ur: NEGATIVE mg/dL
Specific Gravity, Urine: 1.007 (ref 1.005–1.030)
pH: 6.5 (ref 5.0–8.0)

## 2016-07-08 LAB — BASIC METABOLIC PANEL
Anion gap: 7 (ref 5–15)
BUN: 34 mg/dL — ABNORMAL HIGH (ref 6–20)
CALCIUM: 8.9 mg/dL (ref 8.9–10.3)
CO2: 23 mmol/L (ref 22–32)
CREATININE: 1.69 mg/dL — AB (ref 0.61–1.24)
Chloride: 105 mmol/L (ref 101–111)
GFR calc Af Amer: 43 mL/min — ABNORMAL LOW (ref >60)
GFR calc non Af Amer: 37 mL/min — ABNORMAL LOW (ref >60)
GLUCOSE: 198 mg/dL — AB (ref 65–99)
Potassium: 4.9 mmol/L (ref 3.5–5.1)
Sodium: 135 mmol/L (ref 135–145)

## 2016-07-08 LAB — URINE MICROSCOPIC-ADD ON

## 2016-07-08 MED ORDER — DEXTROSE 5 % IV SOLN
1.0000 g | Freq: Once | INTRAVENOUS | Status: AC
  Administered 2016-07-08: 1 g via INTRAVENOUS
  Filled 2016-07-08: qty 10

## 2016-07-08 MED ORDER — CEPHALEXIN 500 MG PO CAPS: 500.0000 mg | ORAL_CAPSULE | Freq: Two times a day (BID) | ORAL | 0 refills | Status: AC

## 2016-07-08 MED ORDER — SODIUM CHLORIDE 0.9 % IV BOLUS (SEPSIS)
500.0000 mL | Freq: Once | INTRAVENOUS | Status: AC
  Administered 2016-07-08: 500 mL via INTRAVENOUS

## 2016-07-08 MED ORDER — LIDOCAINE-EPINEPHRINE-TETRACAINE (LET) SOLUTION
3.0000 mL | Freq: Once | NASAL | Status: AC
  Administered 2016-07-08: 3 mL via TOPICAL
  Filled 2016-07-08: qty 3

## 2016-07-08 NOTE — Discharge Instructions (Signed)
Read the information below.  Use the prescribed medication as directed.  Please discuss all new medications with your pharmacist.  You may return to the Emergency Department at any time for worsening condition or any new symptoms that concern you.     Abdominal (belly) pain can be caused by many things. Your caregiver performed an examination and possibly ordered blood/urine tests and imaging (CT scan, x-rays, ultrasound). Many cases can be observed and treated at home after initial evaluation in the emergency department. Even though you are being discharged home, abdominal pain can be unpredictable. Therefore, you need a repeated exam if your pain does not resolve, returns, or worsens. Most patients with abdominal pain don't have to be admitted to the hospital or have surgery, but serious problems like appendicitis and gallbladder attacks can start out as nonspecific pain. Many abdominal conditions cannot be diagnosed in one visit, so follow-up evaluations are very important. SEEK IMMEDIATE MEDICAL ATTENTION IF: The pain does not go away or becomes severe.  A temperature above 101 develops.  Repeated vomiting occurs (multiple episodes).  The pain becomes localized to portions of the abdomen. The right side could possibly be appendicitis. In an adult, the left lower portion of the abdomen could be colitis or diverticulitis.  Blood is being passed in stools or vomit (bright red or black tarry stools).  Return also if you develop chest pain, difficulty breathing, dizziness or fainting, or become confused, poorly responsive, or inconsolable (young children).

## 2016-07-08 NOTE — Progress Notes (Signed)
Pt noted with eyes closed when Cm entered his room Noted bandage on forehead Cm spoke with male and male visitor at bedside Male confirmed pt has seen Dr Willey BladeEric Dean but 3 years ago when [facility] not [facility]  Confirms [facility] is seen by [facility] doctor

## 2016-07-08 NOTE — ED Notes (Addendum)
Unable to complete ekg PT currently in xray

## 2016-07-08 NOTE — ED Triage Notes (Signed)
Per EMS pt from [facility] where found on floor by another resident.  Per EMS patient is at his baseline.  Patient has laceration to forehead and abrasion to nose.

## 2016-07-08 NOTE — ED Provider Notes (Signed)
Level V caveat dementia.. Patient reportedly fell at memory care unit today injuring his face. Patient is nonverbal at baseline. On exam patient is alert he has abrasions to the bridge of his nose into his forehead otherwise atraumatic. Entire spine is nontender. Chest nontender. Abdomen nontender. Heart regular rate and rhythm. All 4 extremity is a contusion abrasion or tenderness or deformity.   Doug SouSam Winson Eichorn, MD 07/08/16 1728

## 2016-07-08 NOTE — ED Provider Notes (Signed)
[facility]-EMERGENCY DEPT Provider Note   CSN: [id number] Arrival date & time: 07/08/16  1041     History   Chief Complaint Chief Complaint  Patient presents with  . Fall  . Facial Injury    HPI Jake Rollins is a 82 y.o. male.  HPI   Patient brought in by EMS after being found on ground at memory care unit with abrasions to forehead.    Pt unable to contribute, does not talk at baseline per wife.  Per wife, pt appears to be at his baseline. Wife states facility told them that staff found patient on the floor beside his chair in his room.    Level V caveat for dementia.   Past Medical History:  Diagnosis Date  . AAA (abdominal aortic aneurysm) without rupture (HCC)    Mild infrarenal to play with left common iliac ears and  . Alzheimer disease    Moderate dementia  . CKD (chronic kidney disease), stage III   . Coronary artery disease 2008    LAD of roughly 40% and 50%; small (<78mm) D2 - 90% ostial; small OM3 - ostial 90%  . Diabetic peripheral neuropathy associated with type 2 diabetes mellitus (HCC)   . Dyslipidemia   . Hypertension   . Peripheral neuropathy (HCC)   . Type 2 diabetes mellitus with peripheral autonomic neuropathy (HCC)    On JANUMET    Patient Active Problem List   Diagnosis Date Noted  . Shock circulatory (HCC) 07/05/2014  . Encephalopathy acute 07/05/2014  . Hyperosmolar non-ketotic state in patient with type 2 diabetes mellitus (HCC) 07/05/2014  . Hypernatremia 07/05/2014  . AKI (acute kidney injury) (HCC) 07/05/2014  . Altered mental status 07/04/2014  . Sepsis (HCC) 07/04/2014  . Edema of both legs 06/02/2013  . AAA (abdominal aortic aneurysm) without rupture (HCC)   . Dyslipidemia   . HTN (hypertension) 12/07/2012  . Alzheimer's dementia-moderate 12/07/2012  . Chronic kidney disease 09/30/2012  . CAD (coronary artery disease) 09/30/2012    Past Surgical History:  Procedure Laterality Date  . CARDIAC CATHETERIZATION  2008   40-50% LAD  lesions, 90% ostial D2 (less than 2 mm) 80% ostial OM 3 (less than 2 mm)  . DOPPLER ECHOCARDIOGRAPHY  April 2009   Borderline concentric LVH, otherwise normal.  . Lower Extremity Artery Dopplers     AAA  minimal dilation just proximal to the iliac bifurcation measuring 3.3 x 3.45 cm.  Left common iliac was aneurysmal dilatation measuring 2.57-2.7 cm; ABIs: 017) 1.0 left.  Bilateral arteries with 3 vessel runoff   . NM MYOVIEW LTD  February 2010   No ischemia or infarction       Home Medications    Prior to Admission medications   Medication Sig Start Date End Date Taking? Authorizing Provider  acetaminophen (TYLENOL) 325 MG tablet Take 650 mg by mouth 2 (two) times daily.    Yes Historical Provider, MD  acetaminophen (TYLENOL) 500 MG tablet Take 500 mg by mouth daily as needed for mild pain or moderate pain.   Yes Historical Provider, MD  cholecalciferol (VITAMIN D) 1000 UNITS tablet Take 1,000 Units by mouth daily.   Yes Historical Provider, MD  diclofenac sodium (VOLTAREN) 1 % GEL Apply 2 g topically 3 (three) times daily. Pt applies to knees.   Yes Historical Provider, MD  divalproex (DEPAKOTE SPRINKLE) 125 MG capsule Take 500-750 mg by mouth 2 (two) times daily. Pt takes six capsules with breakfast and four capsules with dinner. Pt opens  and sprinkles capsules on food.   Yes Historical Provider, MD  gabapentin (NEURONTIN) 100 MG capsule Take 100 mg by mouth daily.   Yes Historical Provider, MD  insulin glargine (LANTUS) 100 UNIT/ML injection Inject 28 Units into the skin at bedtime.   Yes Historical Provider, MD  insulin regular (NOVOLIN R,HUMULIN R) 100 units/mL injection Inject 20 Units into the skin 3 (three) times daily as needed (for BS greater than 400).   Yes Historical Provider, MD  loratadine (CLARITIN) 10 MG tablet Take 10 mg by mouth daily.   Yes Historical Provider, MD  Nutritional Supplements (SUPLENA 1.8/CARBSTEADY) LIQD Take 1 Can by mouth daily.   Yes Historical  Provider, MD  cephALEXin (KEFLEX) 500 MG capsule Take 1 capsule (500 mg total) by mouth 2 (two) times daily. 07/08/16   Trixie Dredge, PA-C    Family History Family History  Problem Relation Age of Onset  . CAD      Noncontributory    Social History Social History  Substance Use Topics  . Smoking status: Former Smoker    Quit date: 11/16/1992  . Smokeless tobacco: Not on file  . Alcohol use Not on file     Allergies   Demerol [meperidine]   Review of Systems Review of Systems  Unable to perform ROS: Dementia     Physical Exam Updated Vital Signs BP (!) 164/101 (BP Location: Left Arm)   Pulse 68   Temp 97.7 F (36.5 C)   Resp 16   SpO2 98%   Physical Exam  Constitutional: He appears well-developed and well-nourished. No distress.  HENT:  Head: Normocephalic.  Right forehead with abrasion overlying hematoma with small pinpoint area that is more open, blood coming through slowly. Nasal bridge with abrasion.    Neck: Neck supple.  Cardiovascular: Normal rate and regular rhythm.   Pulmonary/Chest: Effort normal and breath sounds normal. No respiratory distress. He has no wheezes. He has no rales.  Abdominal: Soft. He exhibits no distension and no mass. There is tenderness. There is no rebound and no guarding.  Neurological: He exhibits normal muscle tone.  Sleeping.  Wakes up and opens eyes but does not respond to commands, does not speak.    Skin: He is not diaphoretic.  Nursing note and vitals reviewed.    ED Treatments / Results  Labs (all labs ordered are listed, but only abnormal results are displayed) Labs Reviewed  URINALYSIS, ROUTINE W REFLEX MICROSCOPIC (NOT AT [facility]) - Abnormal; Notable for the following:       Result Value   Glucose, UA 100 (*)    Hgb urine dipstick LARGE (*)    Leukocytes, UA SMALL (*)    All other components within normal limits  CBC WITH DIFFERENTIAL/PLATELET - Abnormal; Notable for the following:    RBC 3.74 (*)    Hemoglobin  11.5 (*)    HCT 35.5 (*)    Platelets 111 (*)    All other components within normal limits  BASIC METABOLIC PANEL - Abnormal; Notable for the following:    Glucose, Bld 198 (*)    BUN 34 (*)    Creatinine, Ser 1.69 (*)    GFR calc non Af Amer 37 (*)    GFR calc Af Amer 43 (*)    All other components within normal limits  URINE MICROSCOPIC-ADD ON - Abnormal; Notable for the following:    Squamous Epithelial / LPF 0-5 (*)    Bacteria, UA MANY (*)    All other  components within normal limits    EKG  EKG Interpretation  Date/Time:  Tuesday July 08 2016 13:01:29 EDT Ventricular Rate:  66 PR Interval:    QRS Duration: 90 QT Interval:  418 QTC Calculation: 438 R Axis:   53 Text Interpretation:  Sinus rhythm Prolonged PR interval No significant change since last tracing Confirmed by Ethelda Chick  MD, SAM [phone]) on 07/08/2016 1:07:07 PM       Radiology Dg Chest 2 View  Result Date: 07/08/2016 CLINICAL DATA:  Unwitnessed fall at nursing home. History of AAA, coronary artery disease and hypertension. EXAM: CHEST  2 VIEW COMPARISON:  07/05/2014, 08/16/2007 FINDINGS: AP and lateral views chest. No acute pulmonary infiltrate, consolidation or effusion. Cardiomediastinal silhouette stable with atherosclerosis of the aorta. No pneumothorax. Degenerative osteophytes of the spine. Minimal anterior wedging of upper thoracic vertebra does not show significant interval change. IMPRESSION: No radiographic evidence for acute cardiopulmonary abnormality Atherosclerotic vascular disease of the aorta Electronically Signed   By: Jasmine Pang M.D.   On: 07/08/2016 12:45   Ct Head Wo Contrast  Result Date: 07/08/2016 CLINICAL DATA:  pt from [facility] where found on floor by another resident. Per EMS patient is at his baseline. Patient has laceration to forehead and abrasion to nose. Unable to obtain any history from patient. EXAM: CT HEAD WITHOUT CONTRAST CT CERVICAL SPINE WITHOUT  CONTRAST TECHNIQUE: Multidetector CT imaging of the head and cervical spine was performed following the standard protocol without intravenous contrast. Multiplanar CT image reconstructions of the cervical spine were also generated. COMPARISON:  None. FINDINGS: CT HEAD FINDINGS Brain: Ventricles normal configuration. There is ventricular and sulcal enlargement reflecting moderate diffuse atrophy. No hydrocephalus. There are no parenchymal masses or mass effect. There is no evidence of a recent cortical infarct. Periventricular white matter hypoattenuation is noted with a few more discrete deep white matter areas of hypoattenuation consistent with a combination of mild chronic microvascular ischemic change and small old deep white matter lacune infarcts. There are no extra-axial masses or abnormal fluid collections. There is no intracranial hemorrhage. Vascular: No hyperdense vessel or unexpected calcification. Skull: Normal. Negative for fracture or focal lesion. Sinuses/Orbits: No acute finding. Other: Right frontal scalp hematoma with a few bubbles of air consistent with an associated laceration. With no radiopaque foreign body. CT CERVICAL SPINE FINDINGS Alignment: Mild reversal of the cervical lordosis, which may be positional. No spondylolisthesis. Skull base and vertebrae: Normal cervical skullbase alignment. All vertebrae normal in height. No fractures. Soft tissues and spinal canal: No soft tissue masses or adenopathy. Carotid artery vascular calcifications. Disc levels: Mild loss disc height at C5-C6. Remaining disc spaces relatively well preserved. There are endplate osteophytes throughout the cervical spine. Facet degenerative changes noted bilaterally, greatest on the left at [place] for 4- C5 all. Is facet and uncovertebral spurring leads to moderate neural foraminal narrowing at or on the left at C3-C4 and C4-C5. No convincing disc herniation. No significant central spinal canal narrowing. Upper chest:  Clear upper lungs. Other: None IMPRESSION: HEAD CT: No acute intracranial abnormalities. Atrophy, small old deep white matter lacune infarcts and mild chronic microvascular ischemic change. Right frontal scalp hematoma/ laceration. No skull fracture. CERVICAL CT:  No fracture or acute finding. Electronically Signed   By: Amie Portland M.D.   On: 07/08/2016 13:03   Ct Cervical Spine Wo Contrast  Result Date: 07/08/2016 CLINICAL DATA:  pt from [facility] where found on floor by another resident. Per EMS patient  is at his baseline. Patient has laceration to forehead and abrasion to nose. Unable to obtain any history from patient. EXAM: CT HEAD WITHOUT CONTRAST CT CERVICAL SPINE WITHOUT CONTRAST TECHNIQUE: Multidetector CT imaging of the head and cervical spine was performed following the standard protocol without intravenous contrast. Multiplanar CT image reconstructions of the cervical spine were also generated. COMPARISON:  None. FINDINGS: CT HEAD FINDINGS Brain: Ventricles normal configuration. There is ventricular and sulcal enlargement reflecting moderate diffuse atrophy. No hydrocephalus. There are no parenchymal masses or mass effect. There is no evidence of a recent cortical infarct. Periventricular white matter hypoattenuation is noted with a few more discrete deep white matter areas of hypoattenuation consistent with a combination of mild chronic microvascular ischemic change and small old deep white matter lacune infarcts. There are no extra-axial masses or abnormal fluid collections. There is no intracranial hemorrhage. Vascular: No hyperdense vessel or unexpected calcification. Skull: Normal. Negative for fracture or focal lesion. Sinuses/Orbits: No acute finding. Other: Right frontal scalp hematoma with a few bubbles of air consistent with an associated laceration. With no radiopaque foreign body. CT CERVICAL SPINE FINDINGS Alignment: Mild reversal of the cervical lordosis, which may be  positional. No spondylolisthesis. Skull base and vertebrae: Normal cervical skullbase alignment. All vertebrae normal in height. No fractures. Soft tissues and spinal canal: No soft tissue masses or adenopathy. Carotid artery vascular calcifications. Disc levels: Mild loss disc height at C5-C6. Remaining disc spaces relatively well preserved. There are endplate osteophytes throughout the cervical spine. Facet degenerative changes noted bilaterally, greatest on the left at [place] for 4- C5 all. Is facet and uncovertebral spurring leads to moderate neural foraminal narrowing at or on the left at C3-C4 and C4-C5. No convincing disc herniation. No significant central spinal canal narrowing. Upper chest: Clear upper lungs. Other: None IMPRESSION: HEAD CT: No acute intracranial abnormalities. Atrophy, small old deep white matter lacune infarcts and mild chronic microvascular ischemic change. Right frontal scalp hematoma/ laceration. No skull fracture. CERVICAL CT:  No fracture or acute finding. Electronically Signed   By: Amie Portlandavid  Ormond M.D.   On: 07/08/2016 13:03    Procedures Procedures (including critical care time)  LACERATION REPAIR Performed by: Trixie DredgeWEST, Shaniece Bussa Authorized by: Trixie DredgeWEST, Lutricia Widjaja Consent: Verbal consent obtained. Risks and benefits: risks, benefits and alternatives were discussed Consent given by: patient Patient identity confirmed: provided demographic data Prepped and Draped in normal sterile fashion Wound explored  Laceration Location: right forehead overlying hematoma within large abrasion  Laceration Length: 0.2 cm  No Foreign Bodies seen or palpated  Anesthesia: topical Local anesthetic: LET   Irrigation method: syringe Amount of cleaning: standard  Skin closure: 5-0 vicryl Number of sutures: 1  Technique: simple interrupted   Patient tolerance: Patient tolerated the procedure well with no immediate complications.   Medications Ordered in ED Medications  sodium  chloride 0.9 % bolus 500 mL (0 mLs Intravenous Stopped 07/08/16 1449)  lidocaine-EPINEPHrine-tetracaine (LET) solution (3 mLs Topical Given 07/08/16 1539)  cefTRIAXone (ROCEPHIN) 1 g in dextrose 5 % 50 mL IVPB (1 g Intravenous New Bag/Given 07/08/16 1805)     Initial Impression / Assessment and Plan / ED Course  I have reviewed the triage vital signs and the nursing notes.  Pertinent labs & imaging results that were available during my care of the patient were reviewed by me and considered in my medical decision making (see chart for details).  Clinical Course  Comment By Time  Patient has paperwork requesting no transport by EMS.  I have called patient's wife and daughter and have left messages to assist me in appropriate treatment based on patient's goals.  Dr Ethelda Chick also made aware of patient.   Trixie Dredge, PA-C 09/12 1158  Patient's wife is now bedside.  She would like a full work up. Trixie Dredge, PA-C 09/12 1210  Wife does want UTI treated with antibiotics  Trixie Dredge, PA-C 09/12 1620    Afebrile, nontoxic patient with advanced dementia who was found on the ground beside the chair in his room with injury to his face.  Pt unable to speak, does not follow commands.  Mild abdominal tenderness on exam.  UA appears infected.  Discussed patient's goals for treatment several times with patient's wife, as did Dr Ethelda Chick.  Wife requested full workup as well as antibiotic treatment with antibiotics.   D/C home back to facility.  Wife is in agreement with this plan.    Final Clinical Impressions(s) / ED Diagnoses   Final diagnoses:  Fall, initial encounter  Facial abrasion, initial encounter  UTI (lower urinary tract infection)    New Prescriptions New Prescriptions   CEPHALEXIN (KEFLEX) 500 MG CAPSULE    Take 1 capsule (500 mg total) by mouth 2 (two) times daily.     Trixie Dredge, PA-C 07/08/16 1849    Doug Sou, MD 07/09/16 1100

## 2016-07-08 NOTE — ED Notes (Signed)
Bed: [facility] Expected date:  Expected time:  Means of arrival:  Comments: 

## 2016-07-08 NOTE — ED Notes (Signed)
[facility] contacted 

## 2016-07-08 NOTE — ED Notes (Signed)
Patient transported to X-ray 

## 2016-07-08 NOTE — ED Notes (Signed)
Bed: [id number] Expected date:  Expected time:  Means of arrival:  Comments: 81/unwitnessed fall

## 2016-07-08 NOTE — ED Notes (Signed)
RN to draw labs with start of IV 

## 2017-05-27 DEATH — deceased

## 2018-07-31 IMAGING — CT CT HEAD W/O CM
4 of 6 series · 15 of 47 positions shown, 16 images · non-contrast
Comparison: None.

CLINICAL DATA: pt [facility] where found on
floor by another resident. Per EMS patient is at his baseline.
Patient has laceration to forehead and abrasion to nose.
Unable to obtain any history from patient.

EXAM:
CT HEAD WITHOUT CONTRAST
CT CERVICAL SPINE WITHOUT CONTRAST
TECHNIQUE: Multidetector CT imaging of the head and cervical spine was
performed following the standard protocol without intravenous
contrast. Multiplanar CT image reconstructions of the cervical spine
were also generated.

[Series 3: head w/o · axial · non-contrast · 0.43mm/px · z∈[+1287,+1377]mm · 4 of 32 slices shown, 5 images]
[im 7/32  brain]
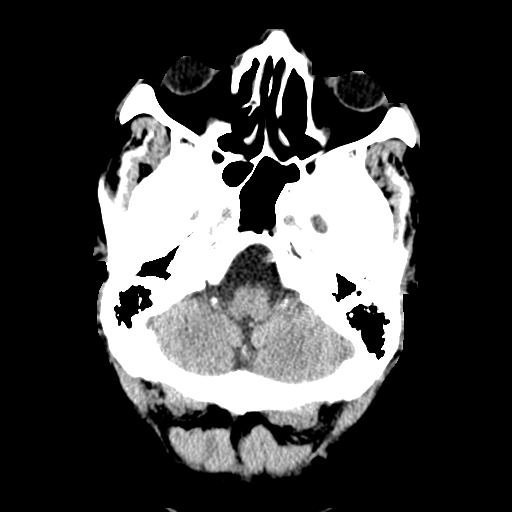
[im 7/32  bone]
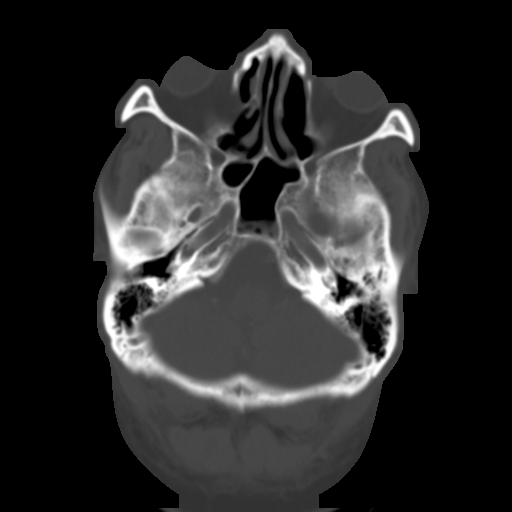
[im 13/32  brain]
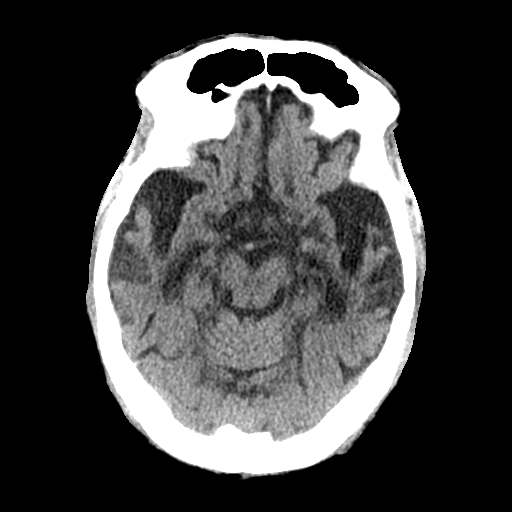
[im 19/32  brain]
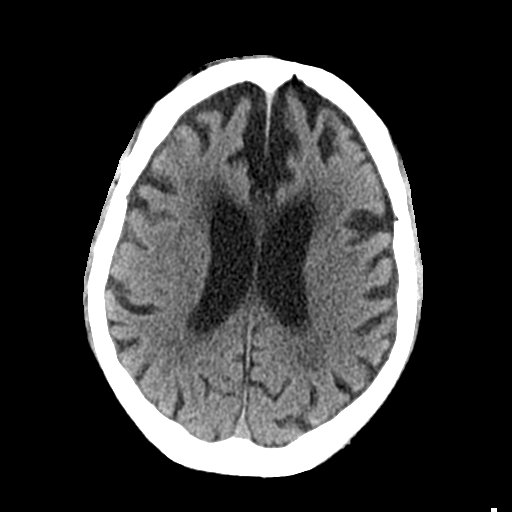
[im 25/32  brain]
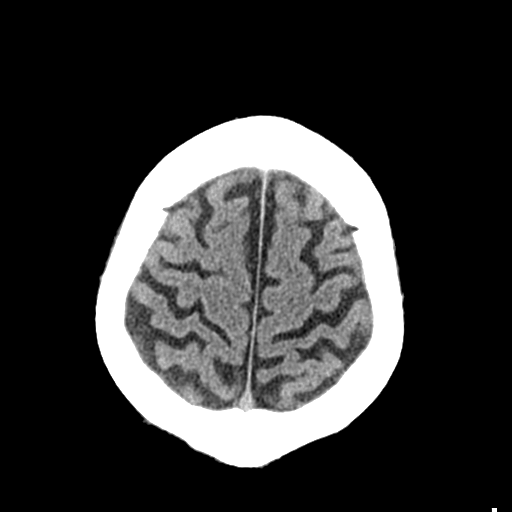

[Series 4: bone windows · axial · 0.43mm/px · z∈[+1272,+1359]mm · 6 of 53 slices shown]
[im 6/53  bone]
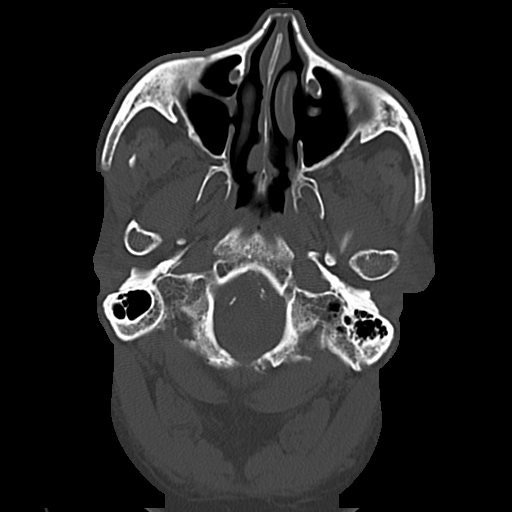
[im 12/53  bone]
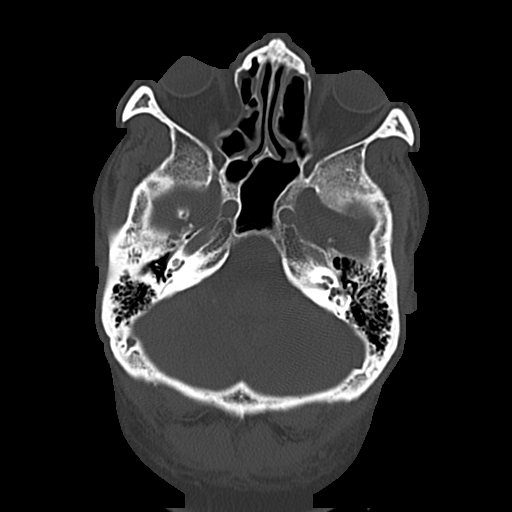
[im 18/53  bone]
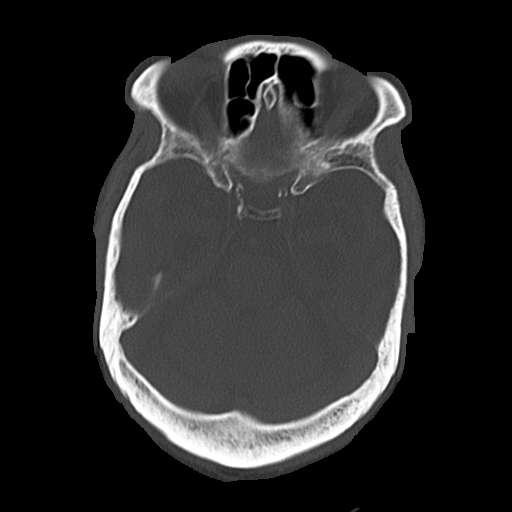
[im 24/53  bone]
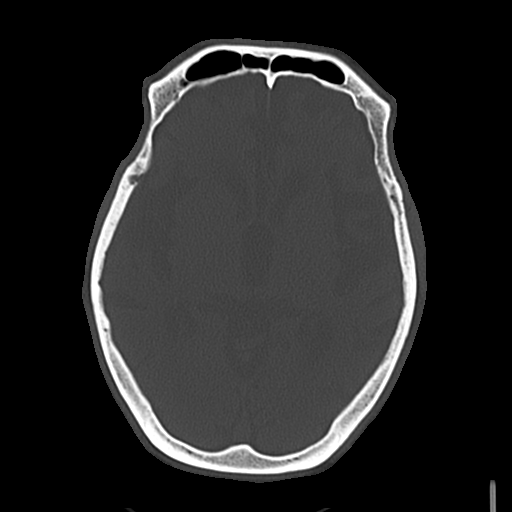
[im 29/53  bone]
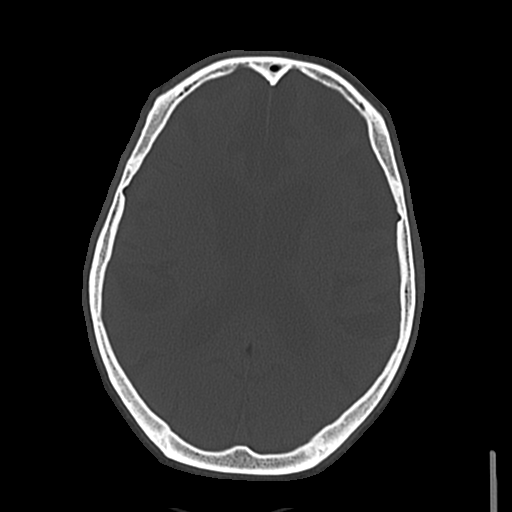
[im 35/53  bone]
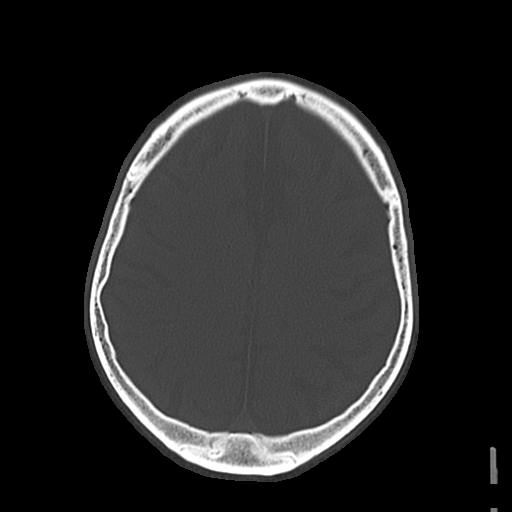

[Series 6: coronal · coronal · 0.33mm/px · 3 of 73 slices shown]
[im 25/73  brain]
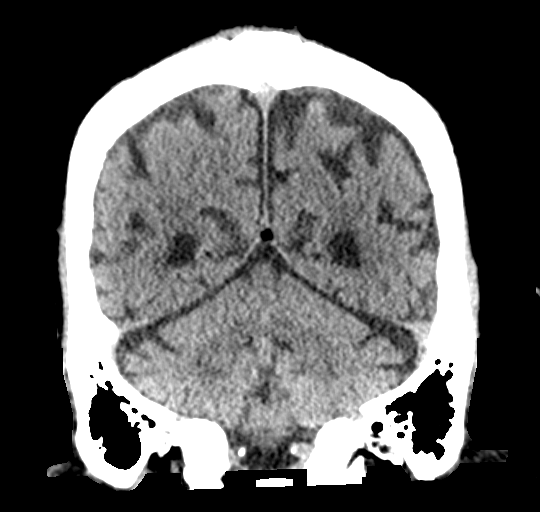
[im 33/73  brain]
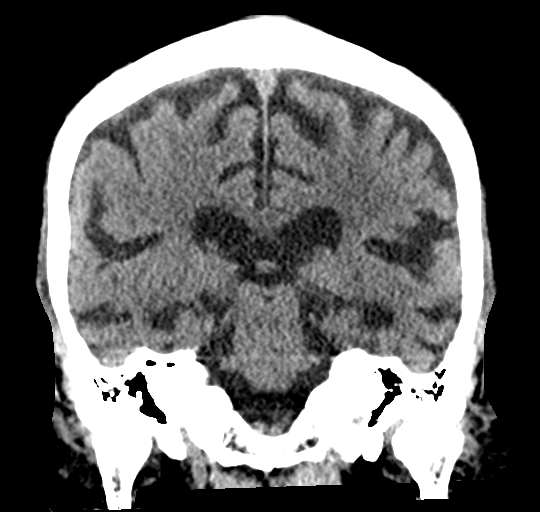
[im 41/73  brain]
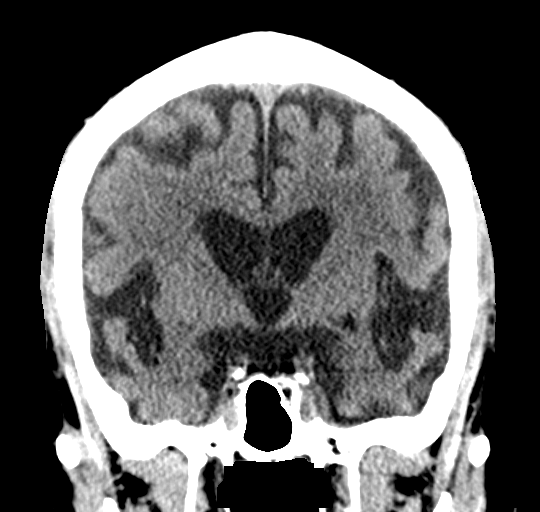

[Series 13: sagittal · sagittal · 0.23mm/px · 2 of 77 slices shown]
[im 26/77  brain]
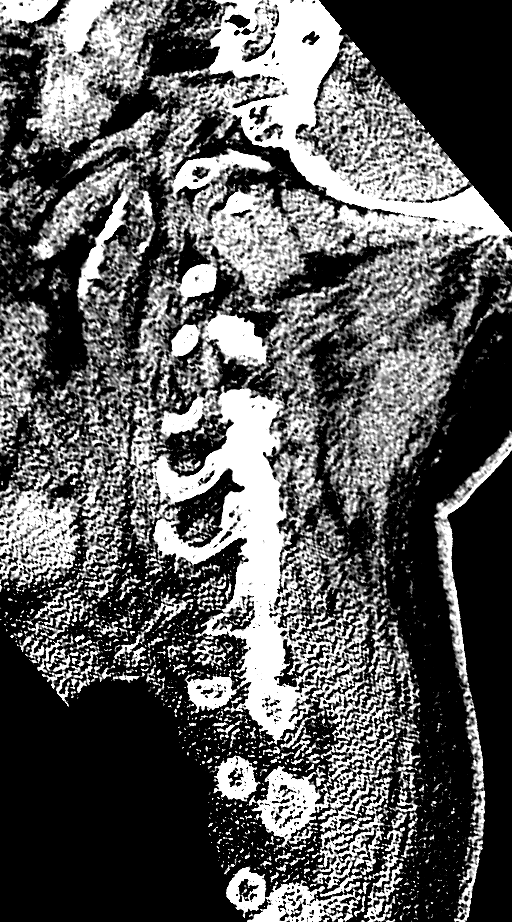
[im 51/77  brain]
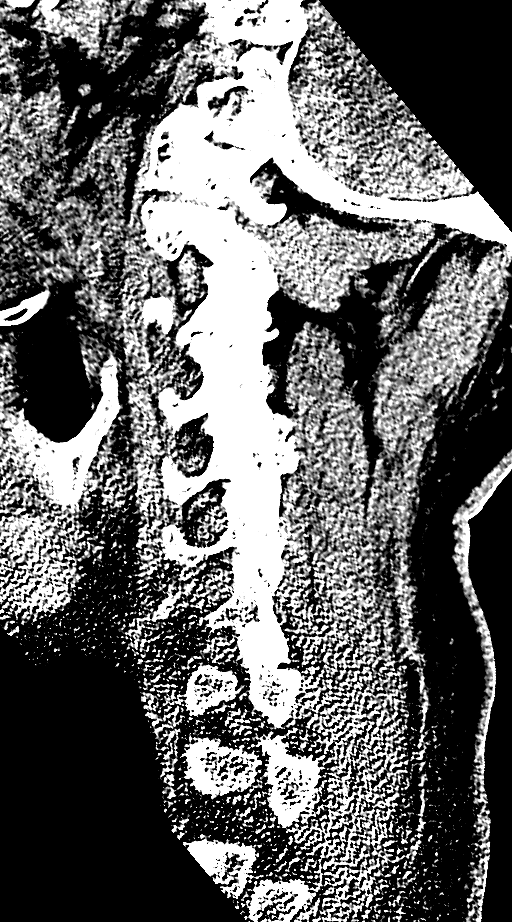

[15 of 47 positions shown; findings below may reference images not displayed]

FINDINGS: CT HEAD FINDINGS

Brain: Ventricles normal configuration. There is ventricular and
sulcal enlargement reflecting moderate diffuse atrophy. No
hydrocephalus.

There are no parenchymal masses or mass effect. There is no evidence
of a recent cortical infarct.

Periventricular white matter hypoattenuation is noted with a few
more discrete deep white matter areas of hypoattenuation consistent
with a combination of mild chronic microvascular ischemic change and
small old deep white matter lacune infarcts.

There are no extra-axial masses or abnormal fluid collections.

There is no intracranial hemorrhage.

Vascular: No hyperdense vessel or unexpected calcification.

Skull: Normal. Negative for fracture or focal lesion.

Sinuses/Orbits: No acute finding.

Other: Right frontal scalp hematoma with a few bubbles of air
consistent with an associated laceration. With no radiopaque foreign
body.

CT CERVICAL SPINE FINDINGS

Alignment: Mild reversal of the cervical lordosis, which may be
positional. No spondylolisthesis.

Skull base and vertebrae: Normal cervical skullbase alignment. All
vertebrae normal in height. No fractures.

Soft tissues and spinal canal: No soft tissue masses or adenopathy.
Carotid artery vascular calcifications.

Disc levels: Mild loss disc height at C5-C6. Remaining disc spaces
relatively well preserved. There are endplate osteophytes throughout
the cervical spine. Facet degenerative changes noted bilaterally,
greatest on the left at [place] for 4- C5 all. Is facet and
uncovertebral spurring leads to moderate neural foraminal narrowing
at or on the left at C3-C4 and C4-C5.

No convincing disc herniation. No significant central spinal canal
narrowing.

Upper chest: Clear upper lungs.

Other: None
IMPRESSION: HEAD CT: No acute intracranial abnormalities. Atrophy, small old
deep white matter lacune infarcts and mild chronic microvascular
ischemic change. Right frontal scalp hematoma/ laceration. No skull
fracture.

CERVICAL CT:  No fracture or acute finding.
